# Patient Record
Sex: Female | Born: 1950 | Race: White | Hispanic: No | Smoking: Never smoker
Health system: Southern US, Community
[De-identification: ages and names within clinical notes are randomized; demographics above are authoritative.]

## PROBLEM LIST (undated history)

## (undated) DIAGNOSIS — T4145XA Adverse effect of unspecified anesthetic, initial encounter: Secondary | ICD-10-CM

## (undated) DIAGNOSIS — I10 Essential (primary) hypertension: Secondary | ICD-10-CM

## (undated) DIAGNOSIS — Z8489 Family history of other specified conditions: Secondary | ICD-10-CM

## (undated) DIAGNOSIS — Z9889 Other specified postprocedural states: Secondary | ICD-10-CM

## (undated) DIAGNOSIS — G4730 Sleep apnea, unspecified: Secondary | ICD-10-CM

## (undated) DIAGNOSIS — R112 Nausea with vomiting, unspecified: Secondary | ICD-10-CM

## (undated) DIAGNOSIS — K219 Gastro-esophageal reflux disease without esophagitis: Secondary | ICD-10-CM

## (undated) DIAGNOSIS — T8859XA Other complications of anesthesia, initial encounter: Secondary | ICD-10-CM

## (undated) DIAGNOSIS — M797 Fibromyalgia: Secondary | ICD-10-CM

## (undated) DIAGNOSIS — M1990 Unspecified osteoarthritis, unspecified site: Secondary | ICD-10-CM

## (undated) HISTORY — PX: CARTILAGE SURGERY: SHX1303

## (undated) HISTORY — PX: WRIST SURGERY: SHX841

## (undated) HISTORY — PX: MENISCUS REPAIR: SHX5179

---

## 1982-09-07 HISTORY — PX: TUBAL LIGATION: SHX77

## 1998-05-15 ENCOUNTER — Other Ambulatory Visit: Admission: RE | Admit: 1998-05-15 | Discharge: 1998-05-15 | Payer: Self-pay | Admitting: Obstetrics and Gynecology

## 1999-09-08 HISTORY — PX: ABDOMINAL HYSTERECTOMY: SHX81

## 1999-10-30 ENCOUNTER — Ambulatory Visit (HOSPITAL_COMMUNITY): Admission: RE | Admit: 1999-10-30 | Discharge: 1999-10-30 | Payer: Self-pay | Admitting: Obstetrics and Gynecology

## 2000-02-25 ENCOUNTER — Inpatient Hospital Stay (HOSPITAL_COMMUNITY): Admission: RE | Admit: 2000-02-25 | Discharge: 2000-02-27 | Payer: Self-pay | Admitting: Obstetrics and Gynecology

## 2003-07-18 ENCOUNTER — Ambulatory Visit (HOSPITAL_BASED_OUTPATIENT_CLINIC_OR_DEPARTMENT_OTHER): Admission: RE | Admit: 2003-07-18 | Discharge: 2003-07-18 | Payer: Self-pay | Admitting: Orthopedic Surgery

## 2003-07-18 ENCOUNTER — Ambulatory Visit (HOSPITAL_COMMUNITY): Admission: RE | Admit: 2003-07-18 | Discharge: 2003-07-18 | Payer: Self-pay | Admitting: Orthopedic Surgery

## 2003-09-08 HISTORY — PX: CHOLECYSTECTOMY: SHX55

## 2018-08-08 ENCOUNTER — Other Ambulatory Visit: Payer: Self-pay | Admitting: Orthopedic Surgery

## 2018-08-29 NOTE — Patient Instructions (Addendum)
Harrington ChallengerDeborah D [name]  08/29/2018   Your procedure is scheduled on: 09-12-18    [facility]    Report to Admitting at 7:25 AM    Call this number if you have problems the morning of surgery [phone]    Remember: Do not eat food or drink liquids :After Midnight.    BRUSH YOUR TEETH MORNING OF SURGERY AND RINSE YOUR MOUTH OUT, NO CHEWING GUM CANDY OR MINTS.     Take these medicines the morning of surgery with A SIP OF WATER: Pantoprazole (Protonix), Gabapentin (Neurotin)                                 You may not have any metal on your body including hair pins and              piercings  Do not wear jewelry, make-up, lotions, powders or perfumes, deodorant             Do not wear nail polish.  Do not shave  48 hours prior to surgery.                 Do not bring valuables to the hospital. [name] IS NOT             RESPONSIBLE   FOR VALUABLES.  Contacts, dentures or bridgework may not be worn into surgery.  Leave suitcase in the car. After surgery it may be brought to your room.    :  Special Instructions: N/A              Please read over the following fact sheets you were given: _____________________________________________________________________             [facility] Before surgery, you can play an important role.  Because skin is not sterile, your skin needs to be as free of germs as possible.  You can reduce the number of germs on your skin by washing with CHG (chlorahexidine gluconate) soap before surgery.  CHG is an antiseptic cleaner which kills germs and bonds with the skin to continue killing germs even after washing. Please DO NOT use if you have an allergy to CHG or antibacterial soaps.  If your skin becomes reddened/irritated stop using the CHG and inform your nurse when you arrive at Short Stay. Do not shave (including legs and underarms) for at least 48 hours prior to the first CHG  shower.  You may shave your face/neck. Please follow these instructions carefully:  1.  Shower with CHG Soap the night before surgery and the  morning of Surgery.  2.  If you choose to wash your hair, wash your hair first as usual with your  normal  shampoo.  3.  After you shampoo, rinse your hair and body thoroughly to remove the  shampoo.                           4.  Use CHG as you would any other liquid soap.  You can apply chg directly  to the skin and wash                       Gently with a scrungie or clean washcloth.  5.  Apply  the CHG Soap to your body ONLY FROM THE NECK DOWN.   Do not use on face/ open                           Wound or open sores. Avoid contact with eyes, ears mouth and genitals (private parts).                       Wash face,  Genitals (private parts) with your normal soap.             6.  Wash thoroughly, paying special attention to the area where your surgery  will be performed.  7.  Thoroughly rinse your body with warm water from the neck down.  8.  DO NOT shower/wash with your normal soap after using and rinsing off  the CHG Soap.                9.  Pat yourself dry with a clean towel.            10.  Wear clean pajamas.            11.  Place clean sheets on your bed the night of your first shower and do not  sleep with pets. Day of Surgery : Do not apply any lotions/deodorants the morning of surgery.  Please wear clean clothes to the hospital/surgery center.  FAILURE TO FOLLOW THESE INSTRUCTIONS MAY RESULT IN THE CANCELLATION OF YOUR SURGERY PATIENT SIGNATURE_________________________________  NURSE SIGNATURE__________________________________  ________________________________________________________________________   [name]  An incentive spirometer is a tool that can help keep your lungs clear and active. This tool measures how well you are filling your lungs with each breath. Taking long deep breaths may help reverse or decrease the chance  of developing breathing (pulmonary) problems (especially infection) following:  A long period of time when you are unable to move or be active. BEFORE THE PROCEDURE   If the spirometer includes an indicator to show your best effort, your nurse or respiratory therapist will set it to a desired goal.  If possible, sit up straight or lean slightly forward. Try not to slouch.  Hold the incentive spirometer in an upright position. INSTRUCTIONS FOR USE  1. Sit on the edge of your bed if possible, or sit up as far as you can in bed or on a chair. 2. Hold the incentive spirometer in an upright position. 3. Breathe out normally. 4. Place the mouthpiece in your mouth and seal your lips tightly around it. 5. Breathe in slowly and as deeply as possible, raising the piston or the ball toward the top of the column. 6. Hold your breath for 3-5 seconds or for as long as possible. Allow the piston or ball to fall to the bottom of the column. 7. Remove the mouthpiece from your mouth and breathe out normally. 8. Rest for a few seconds and repeat Steps 1 through 7 at least 10 times every 1-2 hours when you are awake. Take your time and take a few normal breaths between deep breaths. 9. The spirometer may include an indicator to show your best effort. Use the indicator as a goal to work toward during each repetition. 10. After each set of 10 deep breaths, practice coughing to be sure your lungs are clear. If you have an incision (the cut made at the time of surgery), support your incision when coughing by placing a pillow or rolled  up towels firmly against it. Once you are able to get out of bed, walk around indoors and cough well. You may stop using the incentive spirometer when instructed by your caregiver.  RISKS AND COMPLICATIONS  Take your time so you do not get dizzy or light-headed.  If you are in pain, you may need to take or ask for pain medication before doing incentive spirometry. It is harder to  take a deep breath if you are having pain. AFTER USE  Rest and breathe slowly and easily.  It can be helpful to keep track of a log of your progress. Your caregiver can provide you with a simple table to help with this. If you are using the spirometer at home, follow these instructions: SEEK MEDICAL CARE IF:   You are having difficultly using the spirometer.  You have trouble using the spirometer as often as instructed.  Your pain medication is not giving enough relief while using the spirometer.  You develop fever of 100.5 F (38.1 C) or higher. SEEK IMMEDIATE MEDICAL CARE IF:   You cough up bloody sputum that had not been present before.  You develop fever of 102 F (38.9 C) or greater.  You develop worsening pain at or near the incision site. MAKE SURE YOU:   Understand these instructions.  Will watch your condition.  Will get help right away if you are not doing well or get worse. Document Released: 01/04/2007 Document Revised: 11/16/2011 Document Reviewed: 03/07/2007 [facility] Patient Information 2014 [place] [place].   ________________________________________________________________________

## 2018-09-06 ENCOUNTER — Encounter (HOSPITAL_COMMUNITY)
Admission: RE | Admit: 2018-09-06 | Discharge: 2018-09-06 | Disposition: A | Discharge status: Home / Self Care | Payer: Medicare Other | Source: Ambulatory Visit | Attending: Orthopedic Surgery | Admitting: Orthopedic Surgery

## 2018-09-06 ENCOUNTER — Encounter (HOSPITAL_COMMUNITY): Payer: Self-pay | Admitting: *Deleted

## 2018-09-06 ENCOUNTER — Other Ambulatory Visit: Payer: Self-pay

## 2018-09-06 DIAGNOSIS — Z01818 Encounter for other preprocedural examination: Secondary | ICD-10-CM | POA: Insufficient documentation

## 2018-09-06 DIAGNOSIS — M1711 Unilateral primary osteoarthritis, right knee: Secondary | ICD-10-CM | POA: Diagnosis not present

## 2018-09-06 HISTORY — DX: Unspecified osteoarthritis, unspecified site: M19.90

## 2018-09-06 HISTORY — DX: Other complications of anesthesia, initial encounter: T88.59XA

## 2018-09-06 HISTORY — DX: Sleep apnea, unspecified: G47.30

## 2018-09-06 HISTORY — DX: Adverse effect of unspecified anesthetic, initial encounter: T41.45XA

## 2018-09-06 HISTORY — DX: Gastro-esophageal reflux disease without esophagitis: K21.9

## 2018-09-06 HISTORY — DX: Nausea with vomiting, unspecified: R11.2

## 2018-09-06 HISTORY — DX: Fibromyalgia: M79.7

## 2018-09-06 HISTORY — DX: Essential (primary) hypertension: I10

## 2018-09-06 HISTORY — DX: Other specified postprocedural states: Z98.890

## 2018-09-06 LAB — CBC WITH DIFFERENTIAL/PLATELET
Abs Immature Granulocytes: 0.04 10*3/uL (ref 0.00–0.07)
Basophils Absolute: 0 10*3/uL (ref 0.0–0.1)
Basophils Relative: 0 %
Eosinophils Absolute: 0.1 10*3/uL (ref 0.0–0.5)
Eosinophils Relative: 1 %
HCT: 42.5 % (ref 36.0–46.0)
Hemoglobin: 13.1 g/dL (ref 12.0–15.0)
Immature Granulocytes: 1 %
Lymphocytes Relative: 21 %
Lymphs Abs: 1.6 10*3/uL (ref 0.7–4.0)
MCH: 30.1 pg (ref 26.0–34.0)
MCHC: 30.8 g/dL (ref 30.0–36.0)
MCV: 97.7 fL (ref 80.0–100.0)
MONO ABS: 0.7 10*3/uL (ref 0.1–1.0)
Monocytes Relative: 9 %
NEUTROS ABS: 5.3 10*3/uL (ref 1.7–7.7)
Neutrophils Relative %: 68 %
Platelets: 242 10*3/uL (ref 150–400)
RBC: 4.35 MIL/uL (ref 3.87–5.11)
RDW: 12.4 % (ref 11.5–15.5)
WBC: 7.8 10*3/uL (ref 4.0–10.5)
nRBC: 0 % (ref 0.0–0.2)

## 2018-09-06 LAB — COMPREHENSIVE METABOLIC PANEL
ALT: 25 U/L (ref 0–44)
AST: 28 U/L (ref 15–41)
Albumin: 3.9 g/dL (ref 3.5–5.0)
Alkaline Phosphatase: 74 U/L (ref 38–126)
Anion gap: 8 (ref 5–15)
BUN: 14 mg/dL (ref 8–23)
CO2: 29 mmol/L (ref 22–32)
CREATININE: 0.76 mg/dL (ref 0.44–1.00)
Calcium: 9.1 mg/dL (ref 8.9–10.3)
Chloride: 103 mmol/L (ref 98–111)
GFR calc non Af Amer: >60 mL/min (ref >60)
Glucose, Bld: 103 mg/dL — ABNORMAL HIGH (ref 70–99)
Potassium: 4.1 mmol/L (ref 3.5–5.1)
Sodium: 140 mmol/L (ref 135–145)
Total Bilirubin: 0.9 mg/dL (ref 0.3–1.2)
Total Protein: 6.6 g/dL (ref 6.5–8.1)

## 2018-09-06 LAB — SURGICAL PCR SCREEN
MRSA, PCR: NEGATIVE
Staphylococcus aureus: NEGATIVE

## 2018-09-11 MED ORDER — BUPIVACAINE LIPOSOME 1.3 % IJ SUSP
20.0000 mL | Freq: Once | INTRAMUSCULAR | Status: DC
  Filled 2018-09-11: qty 20

## 2018-09-12 ENCOUNTER — Observation Stay (HOSPITAL_COMMUNITY)
Admission: RE | Admit: 2018-09-12 | Discharge: 2018-09-13 | Disposition: A | Discharge status: Home / Self Care | Payer: Medicare Other | Attending: Orthopedic Surgery | Admitting: Orthopedic Surgery

## 2018-09-12 ENCOUNTER — Ambulatory Visit (HOSPITAL_COMMUNITY): Payer: Medicare Other | Admitting: Certified Registered Nurse Anesthetist

## 2018-09-12 ENCOUNTER — Encounter (HOSPITAL_COMMUNITY)
Admission: RE | Disposition: A | Discharge status: Home / Self Care | Payer: Self-pay | Source: Home / Self Care | Attending: Orthopedic Surgery

## 2018-09-12 ENCOUNTER — Other Ambulatory Visit: Payer: Self-pay

## 2018-09-12 ENCOUNTER — Encounter (HOSPITAL_COMMUNITY): Payer: Self-pay | Admitting: *Deleted

## 2018-09-12 ENCOUNTER — Ambulatory Visit (HOSPITAL_COMMUNITY): Payer: Medicare Other | Admitting: Physician Assistant

## 2018-09-12 DIAGNOSIS — Z7982 Long term (current) use of aspirin: Secondary | ICD-10-CM | POA: Diagnosis not present

## 2018-09-12 DIAGNOSIS — M1711 Unilateral primary osteoarthritis, right knee: Secondary | ICD-10-CM | POA: Diagnosis not present

## 2018-09-12 DIAGNOSIS — K219 Gastro-esophageal reflux disease without esophagitis: Secondary | ICD-10-CM | POA: Diagnosis not present

## 2018-09-12 DIAGNOSIS — I10 Essential (primary) hypertension: Secondary | ICD-10-CM | POA: Diagnosis not present

## 2018-09-12 DIAGNOSIS — G4730 Sleep apnea, unspecified: Secondary | ICD-10-CM | POA: Diagnosis not present

## 2018-09-12 DIAGNOSIS — Z79899 Other long term (current) drug therapy: Secondary | ICD-10-CM | POA: Diagnosis not present

## 2018-09-12 DIAGNOSIS — M25561 Pain in right knee: Secondary | ICD-10-CM | POA: Diagnosis present

## 2018-09-12 DIAGNOSIS — Z96659 Presence of unspecified artificial knee joint: Secondary | ICD-10-CM

## 2018-09-12 HISTORY — PX: TOTAL KNEE ARTHROPLASTY: SHX125

## 2018-09-12 SURGERY — ARTHROPLASTY, KNEE, TOTAL
Anesthesia: Regional | Site: Knee | Laterality: Right

## 2018-09-12 MED ORDER — FENTANYL CITRATE (PF) 100 MCG/2ML IJ SOLN
50.0000 ug | Freq: Once | INTRAMUSCULAR | Status: DC
  Filled 2018-09-12: qty 2

## 2018-09-12 MED ORDER — ONDANSETRON HCL 4 MG/2ML IJ SOLN: 4.0000 mg | Freq: Four times a day (QID) | INTRAMUSCULAR | Status: DC | PRN

## 2018-09-12 MED ORDER — APIXABAN 2.5 MG PO TABS
2.5000 mg | ORAL_TABLET | Freq: Two times a day (BID) | ORAL | Status: DC
  Administered 2018-09-13: 2.5 mg via ORAL
  Filled 2018-09-12: qty 1

## 2018-09-12 MED ORDER — ACETAMINOPHEN 500 MG PO TABS
1000.0000 mg | ORAL_TABLET | Freq: Once | ORAL | Status: AC
  Administered 2018-09-12: 1000 mg via ORAL
  Filled 2018-09-12: qty 2

## 2018-09-12 MED ORDER — DEXAMETHASONE SODIUM PHOSPHATE 10 MG/ML IJ SOLN
10.0000 mg | Freq: Once | INTRAMUSCULAR | Status: AC
  Administered 2018-09-13: 10 mg via INTRAVENOUS
  Filled 2018-09-12: qty 1

## 2018-09-12 MED ORDER — ALUM & MAG HYDROXIDE-SIMETH 200-200-20 MG/5ML PO SUSP: 30.0000 mL | ORAL | Status: DC | PRN

## 2018-09-12 MED ORDER — FENTANYL CITRATE (PF) 100 MCG/2ML IJ SOLN: 25.0000 ug | INTRAMUSCULAR | Status: DC | PRN

## 2018-09-12 MED ORDER — METHOCARBAMOL 500 MG PO TABS
500.0000 mg | ORAL_TABLET | Freq: Four times a day (QID) | ORAL | Status: DC | PRN
  Administered 2018-09-12: 500 mg via ORAL
  Filled 2018-09-12: qty 1

## 2018-09-12 MED ORDER — LIDOCAINE 2% (20 MG/ML) 5 ML SYRINGE
INTRAMUSCULAR | Status: AC
  Filled 2018-09-12: qty 5

## 2018-09-12 MED ORDER — PHENOL 1.4 % MT LIQD: 1.0000 | OROMUCOSAL | Status: DC | PRN

## 2018-09-12 MED ORDER — METHOCARBAMOL 500 MG IVPB - SIMPLE MED
500.0000 mg | Freq: Four times a day (QID) | INTRAVENOUS | Status: DC | PRN
  Administered 2018-09-12: 500 mg via INTRAVENOUS
  Filled 2018-09-12: qty 50

## 2018-09-12 MED ORDER — LACTATED RINGERS IV SOLN
INTRAVENOUS | Status: DC
  Administered 2018-09-12 (×2): via INTRAVENOUS

## 2018-09-12 MED ORDER — DEXAMETHASONE SODIUM PHOSPHATE 10 MG/ML IJ SOLN
8.0000 mg | Freq: Once | INTRAMUSCULAR | Status: AC
  Administered 2018-09-12: 8 mg via INTRAVENOUS

## 2018-09-12 MED ORDER — BUPIVACAINE-EPINEPHRINE (PF) 0.25% -1:200000 IJ SOLN
INTRAMUSCULAR | Status: AC
  Filled 2018-09-12: qty 30

## 2018-09-12 MED ORDER — DEXAMETHASONE SODIUM PHOSPHATE 10 MG/ML IJ SOLN
INTRAMUSCULAR | Status: AC
  Filled 2018-09-12: qty 1

## 2018-09-12 MED ORDER — METHOCARBAMOL 500 MG IVPB - SIMPLE MED
INTRAVENOUS | Status: AC
  Filled 2018-09-12: qty 50

## 2018-09-12 MED ORDER — BISACODYL 5 MG PO TBEC: 5.0000 mg | DELAYED_RELEASE_TABLET | Freq: Every day | ORAL | Status: DC | PRN

## 2018-09-12 MED ORDER — PROPOFOL 10 MG/ML IV BOLUS
INTRAVENOUS | Status: AC
  Filled 2018-09-12: qty 60

## 2018-09-12 MED ORDER — METOCLOPRAMIDE HCL 5 MG PO TABS: 5.0000 mg | ORAL_TABLET | Freq: Three times a day (TID) | ORAL | Status: DC | PRN

## 2018-09-12 MED ORDER — ONDANSETRON HCL 4 MG/2ML IJ SOLN
INTRAMUSCULAR | Status: DC | PRN
  Administered 2018-09-12: 4 mg via INTRAVENOUS

## 2018-09-12 MED ORDER — FERROUS SULFATE 325 (65 FE) MG PO TABS
325.0000 mg | ORAL_TABLET | Freq: Three times a day (TID) | ORAL | Status: DC
  Administered 2018-09-12 – 2018-09-13 (×3): 325 mg via ORAL
  Filled 2018-09-12 (×3): qty 1

## 2018-09-12 MED ORDER — TRANEXAMIC ACID-NACL 1000-0.7 MG/100ML-% IV SOLN
1000.0000 mg | INTRAVENOUS | Status: DC
  Filled 2018-09-12: qty 100

## 2018-09-12 MED ORDER — GABAPENTIN 300 MG PO CAPS
300.0000 mg | ORAL_CAPSULE | Freq: Three times a day (TID) | ORAL | Status: DC
  Administered 2018-09-12 – 2018-09-13 (×2): 300 mg via ORAL
  Filled 2018-09-12 (×3): qty 1

## 2018-09-12 MED ORDER — ONDANSETRON HCL 4 MG PO TABS
4.0000 mg | ORAL_TABLET | Freq: Four times a day (QID) | ORAL | Status: DC | PRN
  Administered 2018-09-12 – 2018-09-13 (×3): 4 mg via ORAL
  Filled 2018-09-12 (×3): qty 1

## 2018-09-12 MED ORDER — PROPOFOL 10 MG/ML IV BOLUS
INTRAVENOUS | Status: AC
  Filled 2018-09-12: qty 20

## 2018-09-12 MED ORDER — PROPOFOL 500 MG/50ML IV EMUL
INTRAVENOUS | Status: DC | PRN
  Administered 2018-09-12: 100 ug/kg/min via INTRAVENOUS

## 2018-09-12 MED ORDER — SENNOSIDES-DOCUSATE SODIUM 8.6-50 MG PO TABS: 1.0000 | ORAL_TABLET | Freq: Every evening | ORAL | Status: DC | PRN

## 2018-09-12 MED ORDER — ONDANSETRON HCL 4 MG/2ML IJ SOLN: 4.0000 mg | Freq: Once | INTRAMUSCULAR | Status: DC | PRN

## 2018-09-12 MED ORDER — HYDROMORPHONE HCL 1 MG/ML IJ SOLN: 0.5000 mg | INTRAMUSCULAR | Status: DC | PRN

## 2018-09-12 MED ORDER — LIDOCAINE 2% (20 MG/ML) 5 ML SYRINGE
INTRAMUSCULAR | Status: DC | PRN
  Administered 2018-09-12: 60 mg via INTRAVENOUS

## 2018-09-12 MED ORDER — PANTOPRAZOLE SODIUM 40 MG PO TBEC
40.0000 mg | DELAYED_RELEASE_TABLET | Freq: Two times a day (BID) | ORAL | Status: DC
  Administered 2018-09-12 – 2018-09-13 (×2): 40 mg via ORAL
  Filled 2018-09-12 (×2): qty 1

## 2018-09-12 MED ORDER — 0.9 % SODIUM CHLORIDE (POUR BTL) OPTIME
TOPICAL | Status: DC | PRN
  Administered 2018-09-12: 1000 mL

## 2018-09-12 MED ORDER — GABAPENTIN 300 MG PO CAPS
300.0000 mg | ORAL_CAPSULE | Freq: Two times a day (BID) | ORAL | Status: DC
  Administered 2018-09-12: 300 mg via ORAL

## 2018-09-12 MED ORDER — MIDAZOLAM HCL 2 MG/2ML IJ SOLN
1.0000 mg | Freq: Once | INTRAMUSCULAR | Status: AC
  Administered 2018-09-12: 1 mg via INTRAVENOUS
  Filled 2018-09-12: qty 2

## 2018-09-12 MED ORDER — METOCLOPRAMIDE HCL 5 MG/ML IJ SOLN: 5.0000 mg | Freq: Three times a day (TID) | INTRAMUSCULAR | Status: DC | PRN

## 2018-09-12 MED ORDER — STERILE WATER FOR IRRIGATION IR SOLN
Status: DC | PRN
  Administered 2018-09-12: 2000 mL

## 2018-09-12 MED ORDER — FLEET ENEMA 7-19 GM/118ML RE ENEM: 1.0000 | ENEMA | Freq: Once | RECTAL | Status: DC | PRN

## 2018-09-12 MED ORDER — MENTHOL 3 MG MT LOZG: 1.0000 | LOZENGE | OROMUCOSAL | Status: DC | PRN

## 2018-09-12 MED ORDER — CHLORHEXIDINE GLUCONATE 4 % EX LIQD: 60.0000 mL | Freq: Once | CUTANEOUS | Status: DC

## 2018-09-12 MED ORDER — PHENYLEPHRINE 40 MCG/ML (10ML) SYRINGE FOR IV PUSH (FOR BLOOD PRESSURE SUPPORT)
PREFILLED_SYRINGE | INTRAVENOUS | Status: AC
  Filled 2018-09-12: qty 10

## 2018-09-12 MED ORDER — ACETAMINOPHEN 500 MG PO TABS
1000.0000 mg | ORAL_TABLET | Freq: Four times a day (QID) | ORAL | Status: DC
  Administered 2018-09-12 – 2018-09-13 (×3): 1000 mg via ORAL
  Filled 2018-09-12 (×2): qty 2

## 2018-09-12 MED ORDER — EPHEDRINE SULFATE-NACL 50-0.9 MG/10ML-% IV SOSY
PREFILLED_SYRINGE | INTRAVENOUS | Status: DC | PRN
  Administered 2018-09-12: 5 mg via INTRAVENOUS
  Administered 2018-09-12: 7.5 mg via INTRAVENOUS
  Administered 2018-09-12: 5 mg via INTRAVENOUS

## 2018-09-12 MED ORDER — EPHEDRINE 5 MG/ML INJ
INTRAVENOUS | Status: AC
  Filled 2018-09-12: qty 10

## 2018-09-12 MED ORDER — DOCUSATE SODIUM 100 MG PO CAPS
100.0000 mg | ORAL_CAPSULE | Freq: Two times a day (BID) | ORAL | Status: DC
  Administered 2018-09-12 – 2018-09-13 (×2): 100 mg via ORAL
  Filled 2018-09-12 (×2): qty 1

## 2018-09-12 MED ORDER — OXYCODONE HCL 5 MG PO TABS
5.0000 mg | ORAL_TABLET | ORAL | Status: DC | PRN
  Administered 2018-09-12 – 2018-09-13 (×2): 5 mg via ORAL
  Administered 2018-09-13: 10 mg via ORAL
  Filled 2018-09-12 (×2): qty 2
  Filled 2018-09-12: qty 1

## 2018-09-12 MED ORDER — TRANEXAMIC ACID-NACL 1000-0.7 MG/100ML-% IV SOLN
1000.0000 mg | Freq: Once | INTRAVENOUS | Status: AC
  Administered 2018-09-12: 1000 mg via INTRAVENOUS
  Filled 2018-09-12: qty 100

## 2018-09-12 MED ORDER — SODIUM CHLORIDE 0.9 % IV SOLN
INTRAVENOUS | Status: DC
  Administered 2018-09-12: 17:00:00 via INTRAVENOUS

## 2018-09-12 MED ORDER — GABAPENTIN 300 MG PO CAPS
300.0000 mg | ORAL_CAPSULE | Freq: Once | ORAL | Status: DC
  Filled 2018-09-12: qty 1

## 2018-09-12 MED ORDER — LOSARTAN POTASSIUM 50 MG PO TABS
100.0000 mg | ORAL_TABLET | Freq: Every day | ORAL | Status: DC
  Administered 2018-09-12 – 2018-09-13 (×2): 100 mg via ORAL
  Filled 2018-09-12 (×2): qty 2

## 2018-09-12 MED ORDER — SODIUM CHLORIDE (PF) 0.9 % IJ SOLN
INTRAMUSCULAR | Status: AC
  Filled 2018-09-12: qty 20

## 2018-09-12 MED ORDER — PHENYLEPHRINE 40 MCG/ML (10ML) SYRINGE FOR IV PUSH (FOR BLOOD PRESSURE SUPPORT)
PREFILLED_SYRINGE | INTRAVENOUS | Status: DC | PRN
  Administered 2018-09-12 (×3): 80 ug via INTRAVENOUS

## 2018-09-12 MED ORDER — SODIUM CHLORIDE 0.9 % IR SOLN
Status: DC | PRN
  Administered 2018-09-12: 1000 mL

## 2018-09-12 MED ORDER — CEFAZOLIN SODIUM-DEXTROSE 2-4 GM/100ML-% IV SOLN
2.0000 g | Freq: Four times a day (QID) | INTRAVENOUS | Status: AC
  Administered 2018-09-12 – 2018-09-13 (×2): 2 g via INTRAVENOUS
  Filled 2018-09-12 (×2): qty 100

## 2018-09-12 MED ORDER — SODIUM CHLORIDE 0.9% FLUSH
INTRAVENOUS | Status: DC | PRN
  Administered 2018-09-12: 20 mL

## 2018-09-12 MED ORDER — PROPOFOL 10 MG/ML IV BOLUS
INTRAVENOUS | Status: DC | PRN
  Administered 2018-09-12: 20 mg via INTRAVENOUS
  Administered 2018-09-12: 30 mg via INTRAVENOUS

## 2018-09-12 MED ORDER — PANTOPRAZOLE SODIUM 40 MG PO TBEC: 40.0000 mg | DELAYED_RELEASE_TABLET | Freq: Every day | ORAL | Status: DC

## 2018-09-12 MED ORDER — BUPIVACAINE-EPINEPHRINE 0.25% -1:200000 IJ SOLN
INTRAMUSCULAR | Status: DC | PRN
  Administered 2018-09-12: 12 mL

## 2018-09-12 MED ORDER — BUPIVACAINE HCL (PF) 0.75 % IJ SOLN
INTRAMUSCULAR | Status: DC | PRN
  Administered 2018-09-12: 1.6 mL via INTRATHECAL

## 2018-09-12 MED ORDER — ROPIVACAINE HCL 5 MG/ML IJ SOLN
INTRAMUSCULAR | Status: DC | PRN
  Administered 2018-09-12: 30 mL via PERINEURAL

## 2018-09-12 MED ORDER — BUPIVACAINE LIPOSOME 1.3 % IJ SUSP
INTRAMUSCULAR | Status: DC | PRN
  Administered 2018-09-12: 20 mL

## 2018-09-12 MED ORDER — ONDANSETRON HCL 4 MG/2ML IJ SOLN
INTRAMUSCULAR | Status: AC
  Filled 2018-09-12: qty 2

## 2018-09-12 MED ORDER — CEFAZOLIN SODIUM-DEXTROSE 2-4 GM/100ML-% IV SOLN
2.0000 g | INTRAVENOUS | Status: AC
  Administered 2018-09-12: 2 g via INTRAVENOUS
  Filled 2018-09-12: qty 100

## 2018-09-12 MED ORDER — ZOLPIDEM TARTRATE 5 MG PO TABS: 5.0000 mg | ORAL_TABLET | Freq: Every evening | ORAL | Status: DC | PRN

## 2018-09-12 MED ORDER — DIPHENHYDRAMINE HCL 12.5 MG/5ML PO ELIX: 12.5000 mg | ORAL_SOLUTION | ORAL | Status: DC | PRN

## 2018-09-12 SURGICAL SUPPLY — 56 items
ARTISURF 11M PLY R 6-9EF KNEE (Knees) ×2 IMPLANT
BAG ZIPLOCK 12X15 (MISCELLANEOUS) ×3 IMPLANT
BANDAGE ACE 6X5 VEL STRL LF (GAUZE/BANDAGES/DRESSINGS) ×3 IMPLANT
BANDAGE ELASTIC 6 VELCRO ST LF (GAUZE/BANDAGES/DRESSINGS) ×2 IMPLANT
BLADE SAGITTAL 13X1.27X60 (BLADE) ×2 IMPLANT
BLADE SAGITTAL 13X1.27X60MM (BLADE) ×1
BLADE SAW SGTL 83.5X18.5 (BLADE) ×3 IMPLANT
BLADE SURG 15 STRL LF DISP TIS (BLADE) ×1 IMPLANT
BLADE SURG 15 STRL SS (BLADE) ×2
BLADE SURG SZ10 CARB STEEL (BLADE) ×6 IMPLANT
BOWL SMART MIX CTS (DISPOSABLE) ×3 IMPLANT
CEMENT BONE SIMPLEX SPEEDSET (Cement) ×6 IMPLANT
CLOSURE WOUND 1/2 X4 (GAUZE/BANDAGES/DRESSINGS) ×2
COMP FEM PERSONA SZ7 RT (Joint) ×3 IMPLANT
COMPONENT FEM PERSONA SZ7 RT (Joint) IMPLANT
COVER SURGICAL LIGHT HANDLE (MISCELLANEOUS) ×3 IMPLANT
COVER WAND RF STERILE (DRAPES) IMPLANT
CUFF TOURN SGL QUICK 34 (TOURNIQUET CUFF) ×2
CUFF TRNQT CYL 34X4X40X1 (TOURNIQUET CUFF) ×1 IMPLANT
DECANTER SPIKE VIAL GLASS SM (MISCELLANEOUS) ×6 IMPLANT
DRAPE INCISE IOBAN 66X45 STRL (DRAPES) ×6 IMPLANT
DRAPE U-SHAPE 47X51 STRL (DRAPES) ×3 IMPLANT
DRSG AQUACEL AG ADV 3.5X10 (GAUZE/BANDAGES/DRESSINGS) ×3 IMPLANT
DURAPREP 26ML APPLICATOR (WOUND CARE) ×6 IMPLANT
ELECT REM PT RETURN 15FT ADLT (MISCELLANEOUS) ×3 IMPLANT
GLOVE BIOGEL M STRL SZ7.5 (GLOVE) ×3 IMPLANT
GLOVE BIOGEL PI IND STRL 7.5 (GLOVE) ×1 IMPLANT
GLOVE BIOGEL PI IND STRL 8.5 (GLOVE) ×2 IMPLANT
GLOVE BIOGEL PI INDICATOR 7.5 (GLOVE) ×2
GLOVE BIOGEL PI INDICATOR 8.5 (GLOVE) ×2
GLOVE SURG ORTHO 8.0 STRL STRW (GLOVE) ×7 IMPLANT
GOWN STRL REUS W/ TWL XL LVL3 (GOWN DISPOSABLE) ×2 IMPLANT
GOWN STRL REUS W/TWL XL LVL3 (GOWN DISPOSABLE) ×4
HANDPIECE INTERPULSE COAX TIP (DISPOSABLE) ×2
HOLDER FOLEY CATH W/STRAP (MISCELLANEOUS) ×3 IMPLANT
HOOD PEEL AWAY FLYTE STAYCOOL (MISCELLANEOUS) ×9 IMPLANT
MANIFOLD NEPTUNE II (INSTRUMENTS) ×3 IMPLANT
NS IRRIG 1000ML POUR BTL (IV SOLUTION) ×3 IMPLANT
PACK TOTAL KNEE CUSTOM (KITS) ×3 IMPLANT
PROTECTOR NERVE ULNAR (MISCELLANEOUS) ×3 IMPLANT
SET HNDPC FAN SPRY TIP SCT (DISPOSABLE) ×1 IMPLANT
STEM POLY PAT PLY 35M KNEE (Knees) ×2 IMPLANT
STEM TIBIA 5 DEG SZ E R KNEE (Knees) IMPLANT
STRIP CLOSURE SKIN 1/2X4 (GAUZE/BANDAGES/DRESSINGS) ×3 IMPLANT
SUT BONE WAX W31G (SUTURE) ×3 IMPLANT
SUT MNCRL AB 3-0 PS2 18 (SUTURE) ×3 IMPLANT
SUT STRATAFIX 0 PDS 27 VIOLET (SUTURE) ×3
SUT STRATAFIX PDS+ 0 24IN (SUTURE) ×3 IMPLANT
SUT VIC AB 1 CT1 36 (SUTURE) ×3 IMPLANT
SUTURE STRATFX 0 PDS 27 VIOLET (SUTURE) ×1 IMPLANT
SYR CONTROL 10ML LL (SYRINGE) ×6 IMPLANT
TIBIA STEM 5 DEG SZ E R KNEE (Knees) ×3 IMPLANT
TRAY FOLEY CATH 14FR (SET/KITS/TRAYS/PACK) ×2 IMPLANT
WATER STERILE IRR 1000ML POUR (IV SOLUTION) ×6 IMPLANT
WRAP KNEE MAXI GEL POST OP (GAUZE/BANDAGES/DRESSINGS) ×3 IMPLANT
YANKAUER SUCT BULB TIP 10FT TU (MISCELLANEOUS) ×3 IMPLANT

## 2018-09-12 NOTE — Transfer of Care (Signed)
Immediate Anesthesia Transfer of Care Note  Patient: Kendra [name]  Procedure(s) Performed: TOTAL KNEE ARTHROPLASTY (Right Knee)  Patient Location: PACU  Anesthesia Type:Spinal  Level of Consciousness: awake, alert , oriented and patient cooperative  Airway & Oxygen Therapy: Patient Spontanous Breathing  Post-op Assessment: Report given to RN and Post -op Vital signs reviewed and stable  Post vital signs: Reviewed and stable  Last Vitals:  Vitals Value Taken Time  BP 125/66 09/12/2018 12:52 PM  Temp    Pulse 69 09/12/2018 12:53 PM  Resp 16 09/12/2018 12:53 PM  SpO2 96 % 09/12/2018 12:53 PM  Vitals shown include unvalidated device data.  Last Pain:  Vitals:   09/12/18 1030  TempSrc:   PainSc: 0-No pain         Complications: No apparent anesthesia complications

## 2018-09-12 NOTE — Anesthesia Procedure Notes (Signed)
Anesthesia Regional Block: Adductor canal block   Pre-Anesthetic Checklist: ,, timeout performed, Correct Patient, Correct Site, Correct Laterality, Correct Procedure,, site marked, risks and benefits discussed, Surgical consent,  Pre-op evaluation,  At surgeon's request and post-op pain management  Laterality: Right  Prep: chloraprep       Needles:  Injection technique: Single-shot  Needle Type: Echogenic Stimulator Needle     Needle Length: 9cm  Needle Gauge: 21     Additional Needles:   Procedures:,,,, ultrasound used (permanent image in chart),,,,  Narrative:  Start time: 09/12/2018 10:10 AM End time: 09/12/2018 10:20 AM Injection made incrementally with aspirations every 5 mL.  Performed by: Personally  Anesthesiologist: Leonides GrillsEllender, Ryan P, MD  Additional Notes: Functioning IV was confirmed and monitors were applied. A time-out was performed. Hand hygiene and sterile gloves were used. The thigh was placed in a frog-leg position and prepped in a sterile fashion. A 90mm 21ga Arrow echogenic stimulator needle was placed using ultrasound guidance.  Negative aspiration and negative test dose prior to incremental administration of local anesthetic. The patient tolerated the procedure well.

## 2018-09-12 NOTE — Anesthesia Postprocedure Evaluation (Signed)
Anesthesia Post Note  Patient: Veronia Piotrowicz Beal  Procedure(s) Performed: TOTAL KNEE ARTHROPLASTY (Right Knee)     Patient location during evaluation: PACU Anesthesia Type: Regional Level of consciousness: oriented and awake and alert Pain management: pain level controlled Vital Signs Assessment: post-procedure vital signs reviewed and stable Respiratory status: spontaneous breathing, respiratory function stable and patient connected to nasal cannula oxygen Cardiovascular status: blood pressure returned to baseline and stable Postop Assessment: no headache, no backache and no apparent nausea or vomiting Anesthetic complications: no    Last Vitals:  Vitals:   09/12/18 1415 09/12/18 1430  BP: (!) 143/77 (!) 170/79  Pulse: 60 62  Resp: 15 16  Temp: (!) 36.3 C (!) 36.4 C  SpO2: 100% 100%    Last Pain:  Vitals:   09/12/18 1430  TempSrc:   PainSc: 0-No pain                 Hodges Treiber COKER

## 2018-09-12 NOTE — Evaluation (Signed)
Physical Therapy Evaluation Patient Details Name: Kendra [name] MRN: [id number] DOB: 1950/09/17 Today's Date: 09/12/2018   History of Present Illness  RTKA  Clinical Impression  The patient ambulated x 100'. Plans Dc home. Pt admitted with above diagnosis. Pt currently with functional limitations due to the deficits listed below (see PT Problem List).  Pt will benefit from skilled PT to increase their independence and safety with mobility to allow discharge to the venue listed below.      Follow Up Recommendations Follow surgeon's recommendation for DC plan and follow-up therapies;Home health PT    Equipment Recommendations  None recommended by PT    Recommendations for Other Services       Precautions / Restrictions Precautions Precautions: Knee;Fall      Mobility  Bed Mobility Overal bed mobility: Needs Assistance Bed Mobility: Supine to Sit     Supine to sit: Min assist     General bed mobility comments: support right knee  Transfers Overall transfer level: Needs assistance Equipment used: Rolling walker (2 wheeled) Transfers: Sit to/from Stand Sit to Stand: Min assist         General transfer comment: cues for hand and  right leg  Ambulation/Gait Ambulation/Gait assistance: Min guard;Min assist Gait Distance (Feet): 100 Feet Assistive device: Rolling walker (2 wheeled) Gait Pattern/deviations: Step-to pattern;Step-through pattern     General Gait Details: cues for sequence  Stairs            Wheelchair Mobility    Modified Rankin (Stroke Patients Only)       Balance                                             Pertinent Vitals/Pain Pain Assessment: 0-10 Pain Score: 3  Pain Location: right knee Pain Descriptors / Indicators: Aching;Discomfort Pain Intervention(s): Premedicated before session;Monitored during session;Ice applied    Home Living Family/patient expects to be discharged to:: Private residence Living  Arrangements: Spouse/significant other;Children Available Help at Discharge: Family Type of Home: House Home Access: Stairs to enter Entrance Stairs-Rails: None Entrance Stairs-Number of Steps: 3 Home Layout: One level Home Equipment: Environmental consultant - 2 wheels;Bedside commode      Prior Function Level of Independence: Independent               Hand Dominance        Extremity/Trunk Assessment   Upper Extremity Assessment Upper Extremity Assessment: Overall WFL for tasks assessed    Lower Extremity Assessment Lower Extremity Assessment: RLE deficits/detail RLE Deficits / Details: + SLR, knne flex0-50       Communication   Communication: No difficulties  Cognition Arousal/Alertness: Awake/alert Behavior During Therapy: WFL for tasks assessed/performed Overall Cognitive Status: Within Functional Limits for tasks assessed                                        General Comments      Exercises     Assessment/Plan    PT Assessment    PT Problem List         PT Treatment Interventions      PT Goals (Current goals can be found in the Care Plan section)  Acute Rehab PT Goals Patient Stated Goal: to go home PT Goal Formulation: With patient Time For Goal  Achievement: 09/19/18 Potential to Achieve Goals: Good    Frequency     Barriers to discharge        Co-evaluation               AM-PAC PT "6 Clicks" Mobility  Outcome Measure Help needed turning from your back to your side while in a flat bed without using bedrails?: A Little Help needed moving from lying on your back to sitting on the side of a flat bed without using bedrails?: A Little Help needed moving to and from a bed to a chair (including a wheelchair)?: A Little Help needed standing up from a chair using your arms (e.g., wheelchair or bedside chair)?: A Little Help needed to walk in hospital room?: A Little Help needed climbing 3-5 steps with a railing? : A Lot 6 Click Score:  17    End of Session   Activity Tolerance: Patient tolerated treatment well Patient left: in chair;with call bell/phone within reach;with family/visitor present Nurse Communication: Mobility status PT Visit Diagnosis: Unsteadiness on feet (R26.81);Pain Pain - Right/Left: Right Pain - part of body: Knee    Time: 1740-1758 PT Time Calculation (min) (ACUTE ONLY): 18 min   Charges:   PT Evaluation $PT Eval Low Complexity: 1 Low          Blanchard Kelch PT Acute Rehabilitation Services Pager [phone] Office [phone]   Rada Hay 09/12/2018, 6:12 PM

## 2018-09-12 NOTE — Anesthesia Preprocedure Evaluation (Addendum)
Anesthesia Evaluation  Patient identified by MRN, date of birth, ID band Patient awake    Reviewed: Allergy & Precautions, NPO status , Patient's Chart, lab work & pertinent test results  History of Anesthesia Complications (+) PONV and history of anesthetic complications  Airway Mallampati: II  TM Distance: >3 FB Neck ROM: Full    Dental no notable dental hx.    Pulmonary sleep apnea ,    Pulmonary exam normal breath sounds clear to auscultation       Cardiovascular hypertension, Pt. on medications Normal cardiovascular exam Rhythm:Regular Rate:Normal     Neuro/Psych negative neurological ROS  negative psych ROS   GI/Hepatic Neg liver ROS, GERD  Medicated and Controlled,  Endo/Other  negative endocrine ROS  Renal/GU negative Renal ROS     Musculoskeletal  (+) Fibromyalgia -  Abdominal (+) + obese,   Peds  Hematology negative hematology ROS (+)   Anesthesia Other Findings RIGHT KNEE OSTEOARTHRITIS  Reproductive/Obstetrics                            Anesthesia Physical Anesthesia Plan  ASA: III  Anesthesia Plan: Regional and Spinal   Post-op Pain Management:    Induction:   PONV Risk Score and Plan: 3 and Midazolam, Ondansetron, Dexamethasone, Propofol infusion and Treatment may vary due to age or medical condition  Airway Management Planned: Natural Airway  Additional Equipment:   Intra-op Plan:   Post-operative Plan:   Informed Consent: I have reviewed the patients History and Physical, chart, labs and discussed the procedure including the risks, benefits and alternatives for the proposed anesthesia with the patient or authorized representative who has indicated his/her understanding and acceptance.   Dental advisory given  Plan Discussed with: CRNA  Anesthesia Plan Comments:        Anesthesia Quick Evaluation

## 2018-09-12 NOTE — Discharge Instructions (Signed)

## 2018-09-12 NOTE — H&P (Signed)
Kendra [name] MRN:  [id number] DOB/SEX:  1951/05/08/female  CHIEF COMPLAINT:  Painful right Knee  HISTORY: Patient is a 68 y.o. female presented with a history of pain in the right knee. Onset of symptoms was gradual starting a few years ago with gradually worsening course since that time. Patient has been treated conservatively with over-the-counter NSAIDs and activity modification. Patient currently rates pain in the knee at 10 out of 10 with activity. There is pain at night.  PAST MEDICAL HISTORY: There are no active problems to display for this patient.  Past Medical History:  Diagnosis Date  . Arthritis   . Complication of anesthesia   . Fibromyalgia   . GERD (gastroesophageal reflux disease)   . Hypertension   . PONV (postoperative nausea and vomiting)   . Sleep apnea    uses mouth guard   Past Surgical History:  Procedure Laterality Date  . ABDOMINAL HYSTERECTOMY  2001  . CARTILAGE SURGERY    . CESAREAN SECTION  1967  . CHOLECYSTECTOMY  2005  . MENISCUS REPAIR    . TUBAL LIGATION  1984  . WRIST SURGERY Right    Torn Ligament     MEDICATIONS:   Medications Prior to Admission  Medication Sig Dispense Refill Last Dose  . acetaminophen (TYLENOL) 500 MG tablet Take 500 mg by mouth every 6 (six) hours as needed for moderate pain.   09/11/2018 at Unknown time  . aspirin EC 81 MG tablet Take 81 mg by mouth daily.      . ferrous sulfate 325 (65 FE) MG EC tablet Take 325 mg by mouth daily with breakfast.   09/11/2018 at Unknown time  . gabapentin (NEURONTIN) 300 MG capsule Take 300 mg by mouth 2 (two) times daily.    09/12/2018 at 0500  . losartan (COZAAR) 100 MG tablet Take 100 mg by mouth daily.   09/11/2018 at Unknown time  . pantoprazole (PROTONIX) 40 MG tablet Take 40 mg by mouth 2 (two) times daily.   09/12/2018 at 0800    ALLERGIES:   Allergies  Allergen Reactions  . Dilaudid [Hydromorphone] Itching  . Hydrocodone-Acetaminophen Nausea And Vomiting  . Nsaids     GI  Issues  . Tramadol Hcl Nausea And Vomiting  . Prednisone Rash    REVIEW OF SYSTEMS:  A comprehensive review of systems was negative except for: Musculoskeletal: positive for arthralgias and bone pain   FAMILY HISTORY:  History reviewed. No pertinent family history.  SOCIAL HISTORY:   Social History   Tobacco Use  . Smoking status: Never Smoker  . Smokeless tobacco: Never Used  Substance Use Topics  . Alcohol use: Never    Frequency: Never     EXAMINATION:  Vital signs in last 24 hours: Temp:  [97.8 F (36.6 C)] 97.8 F (36.6 C) (01/06 0801) Pulse Rate:  [73] 73 (01/06 0801) Resp:  [16] 16 (01/06 0801) BP: (175-199)/(90-95) 175/90 (01/06 0846) SpO2:  [100 %] 100 % (01/06 0801)  BP (!) 175/90   Pulse 73   Temp 97.8 F (36.6 C) (Oral)   Resp 16   SpO2 100%   General Appearance:    Alert, cooperative, no distress, appears stated age  Head:    Normocephalic, without obvious abnormality, atraumatic  Eyes:    PERRL, conjunctiva/corneas clear, EOM's intact, fundi    benign, both eyes  Ears:    Normal TM's and external ear canals, both ears  Nose:   Nares normal, septum midline, mucosa normal, no drainage  or sinus tenderness  Throat:   Lips, mucosa, and tongue normal; teeth and gums normal  Neck:   Supple, symmetrical, trachea midline, no adenopathy;    thyroid:  no enlargement/tenderness/nodules; no carotid   bruit or JVD  Back:     Symmetric, no curvature, ROM normal, no CVA tenderness  Lungs:     Clear to auscultation bilaterally, respirations unlabored  Chest Wall:    No tenderness or deformity   Heart:    Regular rate and rhythm, S1 and S2 normal, no murmur, rub   or gallop  Breast Exam:    No tenderness, masses, or nipple abnormality  Abdomen:     Soft, non-tender, bowel sounds active all four quadrants,    no masses, no organomegaly  Genitalia:    Normal female without lesion, discharge or tenderness  Rectal:    Normal tone, normal prostate, no masses or  tenderness;   guaiac negative stool  Extremities:   Extremities normal, atraumatic, no cyanosis or edema  Pulses:   2+ and symmetric all extremities  Skin:   Skin color, texture, turgor normal, no rashes or lesions  Lymph nodes:   Cervical, supraclavicular, and axillary nodes normal  Neurologic:   CNII-XII intact, normal strength, sensation and reflexes    throughout    Musculoskeletal:  ROM 0-120, Ligaments intact,  Imaging Review Plain radiographs demonstrate severe degenerative joint disease of the right knee. The overall alignment is neutral. The bone quality appears to be good for age and reported activity level.  Assessment/Plan: Primary osteoarthritis, right knee   The patient history, physical examination and imaging studies are consistent with advanced degenerative joint disease of the right knee. The patient has failed conservative treatment.  The clearance notes were reviewed.  After discussion with the patient it was felt that Total Knee Replacement was indicated. The procedure,  risks, and benefits of total knee arthroplasty were presented and reviewed. The risks including but not limited to aseptic loosening, infection, blood clots, vascular injury, stiffness, patella tracking problems complications among others were discussed. The patient acknowledged the explanation, agreed to proceed with the plan.  Preoperative templating of the joint replacement has been completed, documented, and submitted to the Operating Room personnel in order to optimize intra-operative equipment management.    Patient's anticipated LOS is less than 2 midnights, meeting these requirements:  - Lives within 1 hour of care - Has a competent adult at home to recover with post-op recover - NO history of  - Chronic pain requiring opiods  - Diabetes  - Coronary Artery Disease  - Heart failure  - Heart attack  - Stroke  - DVT/VTE  - Cardiac arrhythmia  - Respiratory Failure/COPD  - Renal  failure  - Anemia  - Advanced Liver disease       Kendra [name] 09/12/2018, 8:57 AM

## 2018-09-12 NOTE — Anesthesia Procedure Notes (Signed)
Date/Time: 09/12/2018 11:20 AM Performed by: Vanessa Palmer, CRNA Oxygen Delivery Method: Simple face mask

## 2018-09-12 NOTE — Anesthesia Procedure Notes (Addendum)
Spinal  Patient location during procedure: OR Start time: 09/12/2018 11:02 AM End time: 09/12/2018 11:07 AM Staffing Anesthesiologist: Leonides Grills, MD Resident/CRNA: Vanessa Loomis, CRNA Performed: resident/CRNA  Preanesthetic Checklist Completed: patient identified, site marked, surgical consent, pre-op evaluation, timeout performed, IV checked, risks and benefits discussed and monitors and equipment checked Spinal Block Patient position: sitting Prep: DuraPrep Patient monitoring: heart rate, continuous pulse ox and blood pressure Approach: midline Location: L2-3 Injection technique: single-shot Needle Needle type: Pencan  Needle gauge: 24 G Needle length: 10 cm Needle insertion depth: 9 cm Assessment Sensory level: T6

## 2018-09-13 ENCOUNTER — Encounter (HOSPITAL_COMMUNITY): Payer: Self-pay | Admitting: Orthopedic Surgery

## 2018-09-13 DIAGNOSIS — M1711 Unilateral primary osteoarthritis, right knee: Secondary | ICD-10-CM | POA: Diagnosis not present

## 2018-09-13 LAB — BASIC METABOLIC PANEL
ANION GAP: 8 (ref 5–15)
BUN: 10 mg/dL (ref 8–23)
CO2: 26 mmol/L (ref 22–32)
Calcium: 8.3 mg/dL — ABNORMAL LOW (ref 8.9–10.3)
Chloride: 106 mmol/L (ref 98–111)
Creatinine, Ser: 0.64 mg/dL (ref 0.44–1.00)
GFR calc non Af Amer: >60 mL/min (ref >60)
Glucose, Bld: 128 mg/dL — ABNORMAL HIGH (ref 70–99)
Potassium: 3.9 mmol/L (ref 3.5–5.1)
Sodium: 140 mmol/L (ref 135–145)

## 2018-09-13 LAB — CBC
HCT: 34.7 % — ABNORMAL LOW (ref 36.0–46.0)
Hemoglobin: 10.9 g/dL — ABNORMAL LOW (ref 12.0–15.0)
MCH: 30.7 pg (ref 26.0–34.0)
MCHC: 31.4 g/dL (ref 30.0–36.0)
MCV: 97.7 fL (ref 80.0–100.0)
Platelets: 209 10*3/uL (ref 150–400)
RBC: 3.55 MIL/uL — ABNORMAL LOW (ref 3.87–5.11)
RDW: 12.5 % (ref 11.5–15.5)
WBC: 12.5 10*3/uL — AB (ref 4.0–10.5)
nRBC: 0 % (ref 0.0–0.2)

## 2018-09-13 MED ORDER — APIXABAN 2.5 MG PO TABS: 2.5000 mg | ORAL_TABLET | Freq: Two times a day (BID) | ORAL | 0 refills | Status: DC

## 2018-09-13 MED ORDER — OXYCODONE HCL 5 MG PO TABS: 5.0000 mg | ORAL_TABLET | Freq: Four times a day (QID) | ORAL | 0 refills | Status: DC | PRN

## 2018-09-13 MED ORDER — METHOCARBAMOL 500 MG PO TABS: 500.0000 mg | ORAL_TABLET | Freq: Four times a day (QID) | ORAL | 0 refills | Status: DC | PRN

## 2018-09-13 NOTE — Plan of Care (Signed)
  Problem: Clinical Measurements: Goal: Respiratory complications will improve Outcome: Progressing   Problem: Clinical Measurements: Goal: Cardiovascular complication will be avoided Outcome: Progressing   Problem: Activity: Goal: Risk for activity intolerance will decrease Outcome: Progressing   Problem: Nutrition: Goal: Adequate nutrition will be maintained Outcome: Progressing   Problem: Coping: Goal: Level of anxiety will decrease Outcome: Progressing   

## 2018-09-13 NOTE — Care Management Obs Status (Signed)
MEDICARE OBSERVATION STATUS NOTIFICATION   Patient Details  Name: Kendra GipDeborah D Novicki MRN: [id number] Date of Birth: 1950/10/14   Medicare Observation Status Notification Given:  Yes    Alexis Goodelleele, Nyzier Boivin K, RN 09/13/2018, 11:15 AM

## 2018-09-13 NOTE — Discharge Summary (Signed)
SPORTS MEDICINE & JOINT REPLACEMENT   Georgena Spurling, MD   Laurier Nancy, PA-C [address] [place], [place], [place]  [address]                             [phone]  PATIENT ID: Kendra [name]        MRN:  [id number]          DOB/AGE: [birth year] / 68 y.o.    DISCHARGE SUMMARY  ADMISSION DATE:    09/12/2018 DISCHARGE DATE:   09/13/2018   ADMISSION DIAGNOSIS: RT. KNEE OSTEOARTHRITIS    DISCHARGE DIAGNOSIS:  RT. KNEE OSTEOARTHRITIS    ADDITIONAL DIAGNOSIS: Active Problems:   S/P total knee replacement  Past Medical History:  Diagnosis Date  . Arthritis   . Complication of anesthesia   . Fibromyalgia   . GERD (gastroesophageal reflux disease)   . Hypertension   . PONV (postoperative nausea and vomiting)   . Sleep apnea    uses mouth guard    PROCEDURE: Procedure(s): TOTAL KNEE ARTHROPLASTY on 09/12/2018  CONSULTS:    HISTORY:  See H&P in chart  HOSPITAL COURSE:  CAEDANCE ZUPAN is a 68 y.o. admitted on 09/12/2018 and found to have a diagnosis of RT. KNEE OSTEOARTHRITIS.  After appropriate laboratory studies were obtained  they were taken to the operating room on 09/12/2018 and underwent Procedure(s): TOTAL KNEE ARTHROPLASTY.   They were given perioperative antibiotics:  Anti-infectives (From admission, onward)   Start     Dose/Rate Route Frequency Ordered Stop   09/12/18 1800  ceFAZolin (ANCEF) IVPB 2g/100 mL premix     2 g 200 mL/hr over 30 Minutes Intravenous Every 6 hours 09/12/18 1453 09/13/18 0041   09/12/18 0745  ceFAZolin (ANCEF) IVPB 2g/100 mL premix     2 g 200 mL/hr over 30 Minutes Intravenous On call to O.R. 09/12/18 6720 09/12/18 1123    .  Patient given tranexamic acid IV or topical and exparel intra-operatively.  Tolerated the procedure well.    POD# 1: Vital signs were stable.  Patient denied Chest pain, shortness of breath, or calf pain.  Patient was started on Aspirin twice daily at 8am.  Consults to PT, OT, and care management were made.  The  patient was weight bearing as tolerated.  CPM was placed on the operative leg 0-90 degrees for 6-8 hours a day. When out of the CPM, patient was placed in the foam block to achieve full extension. Incentive spirometry was taught.  Dressing was changed.       POD #2, Continued  PT for ambulation and exercise program.  IV saline locked.  O2 discontinued.    The remainder of the hospital course was dedicated to ambulation and strengthening.   The patient was discharged on 1 Day Post-Op in  Good condition.  Blood products given:none  DIAGNOSTIC STUDIES: Recent vital signs:  Patient Vitals for the past 24 hrs:  BP Temp Temp src Pulse Resp SpO2 Height Weight  09/13/18 0458 139/70 98 F (36.7 C) Oral 67 16 99 % - -  09/13/18 0121 130/66 97.8 F (36.6 C) Oral 68 16 97 % - -  09/12/18 2040 (!) 145/76 97.9 F (36.6 C) Oral 74 16 98 % - -  09/12/18 1925 (!) 152/68 97.9 F (36.6 C) - 68 16 94 % - -  09/12/18 1826 (!) 159/84 97.6 F (36.4 C) - 79 16 90 % - -  09/12/18  1726 (!) 169/81 97.8 F (36.6 C) Oral 74 15 99 % - -  09/12/18 1636 (!) 162/90 (!) 97.5 F (36.4 C) - 76 16 97 % 5\' 5"  (1.651 m) 102.1 kg  09/12/18 1530 (!) 172/83 (!) 97.5 F (36.4 C) - 74 16 97 % - -  09/12/18 1430 (!) 170/79 (!) 97.5 F (36.4 C) - 62 16 100 % - -  09/12/18 1415 (!) 143/77 (!) 97.4 F (36.3 C) - 60 15 100 % - -  09/12/18 1400 (!) 155/81 (!) 97.4 F (36.3 C) - 63 15 98 % - -  09/12/18 1345 (!) 157/71 (!) 97.4 F (36.3 C) - 62 17 100 % - -  09/12/18 1330 (!) 145/71 (!) 97.4 F (36.3 C) - (!) 58 12 100 % - -  09/12/18 1315 138/73 - - 61 13 100 % - -  09/12/18 1300 129/70 - - 67 20 100 % - -  09/12/18 1252 125/66 - - 69 14 94 % - -  09/12/18 1030 120/73 - - 66 13 100 % - -  09/12/18 1028 - - - 66 16 100 % - -  09/12/18 1027 - - - 67 18 100 % - -  09/12/18 1026 - - - 69 20 100 % - -  09/12/18 1025 - - - 64 13 100 % - -  09/12/18 1024 (!) 158/74 - - 72 - 100 % - -  09/12/18 1023 - - - 67 - 99 % - -   09/12/18 1022 - - - 70 - 97 % - -  09/12/18 1021 - - - 77 - 97 % - -  09/12/18 1020 - - - 76 12 98 % - -  09/12/18 1019 - - - 74 - 100 % - -  09/12/18 1018 (!) 173/89 - - - - - - -  09/12/18 1015 (!) 167/91 - - 74 13 100 % - -  09/12/18 0846 (!) 175/90 - - - - - - -  09/12/18 0801 (!) 199/95 97.8 F (36.6 C) Oral 73 16 100 % - -       Recent laboratory studies: Recent Labs    09/06/18 1200 09/13/18 0357  WBC 7.8 12.5*  HGB 13.1 10.9*  HCT 42.5 34.7*  PLT 242 209   Recent Labs    09/06/18 1200 09/13/18 0357  NA 140 140  K 4.1 3.9  CL 103 106  CO2 29 26  BUN 14 10  CREATININE 0.76 0.64  GLUCOSE 103* 128*  CALCIUM 9.1 8.3*   No results found for: INR, PROTIME   Recent Radiographic Studies :  No results found.  DISCHARGE INSTRUCTIONS: Discharge Instructions    Call MD / Call 911   Complete by:  As directed    If you experience chest pain or shortness of breath, CALL 911 and be transported to the hospital emergency room.  If you develope a fever above 101 F, pus (white drainage) or increased drainage or redness at the wound, or calf pain, call your surgeon's office.   Constipation Prevention   Complete by:  As directed    Drink plenty of fluids.  Prune juice may be helpful.  You may use a stool softener, such as Colace (over the counter) 100 mg twice a day.  Use MiraLax (over the counter) for constipation as needed.   Diet - low sodium heart healthy   Complete by:  As directed    Discharge instructions   Complete  by:  As directed    INSTRUCTIONS AFTER JOINT REPLACEMENT   Remove items at home which could result in a fall. This includes throw rugs or furniture in walking pathways ICE to the affected joint every three hours while awake for 30 minutes at a time, for at least the first 3-5 days, and then as needed for pain and swelling.  Continue to use ice for pain and swelling. You may notice swelling that will progress down to the foot and ankle.  This is normal after  surgery.  Elevate your leg when you are not up walking on it.   Continue to use the breathing machine you got in the hospital (incentive spirometer) which will help keep your temperature down.  It is common for your temperature to cycle up and down following surgery, especially at night when you are not up moving around and exerting yourself.  The breathing machine keeps your lungs expanded and your temperature down.   DIET:  As you were doing prior to hospitalization, we recommend a well-balanced diet.  DRESSING / WOUND CARE / SHOWERING  Keep the surgical dressing until follow up.  The dressing is water proof, so you can shower without any extra covering.  IF THE DRESSING FALLS OFF or the wound gets wet inside, change the dressing with sterile gauze.  Please use good hand washing techniques before changing the dressing.  Do not use any lotions or creams on the incision until instructed by your surgeon.    ACTIVITY  Increase activity slowly as tolerated, but follow the weight bearing instructions below.   No driving for 6 weeks or until further direction given by your physician.  You cannot drive while taking narcotics.  No lifting or carrying greater than 10 lbs. until further directed by your surgeon. Avoid periods of inactivity such as sitting longer than an hour when not asleep. This helps prevent blood clots.  You may return to work once you are authorized by your doctor.     WEIGHT BEARING   Weight bearing as tolerated with assist device (walker, cane, etc) as directed, use it as long as suggested by your surgeon or therapist, typically at least 4-6 weeks.   EXERCISES  Results after joint replacement surgery are often greatly improved when you follow the exercise, range of motion and muscle strengthening exercises prescribed by your doctor. Safety measures are also important to protect the joint from further injury. Any time any of these exercises cause you to have increased pain or  swelling, decrease what you are doing until you are comfortable again and then slowly increase them. If you have problems or questions, call your caregiver or physical therapist for advice.   Rehabilitation is important following a joint replacement. After just a few days of immobilization, the muscles of the leg can become weakened and shrink (atrophy).  These exercises are designed to build up the tone and strength of the thigh and leg muscles and to improve motion. Often times heat used for twenty to thirty minutes before working out will loosen up your tissues and help with improving the range of motion but do not use heat for the first two weeks following surgery (sometimes heat can increase post-operative swelling).   These exercises can be done on a training (exercise) mat, on the floor, on a table or on a bed. Use whatever works the best and is most comfortable for you.    Use music or television while you are exercising so that  the exercises are a pleasant break in your day. This will make your life better with the exercises acting as a break in your routine that you can look forward to.   Perform all exercises about fifteen times, three times per day or as directed.  You should exercise both the operative leg and the other leg as well.   Exercises include:   Quad Sets - Tighten up the muscle on the front of the thigh (Quad) and hold for 5-10 seconds.   Straight Leg Raises - With your knee straight (if you were given a brace, keep it on), lift the leg to 60 degrees, hold for 3 seconds, and slowly lower the leg.  Perform this exercise against resistance later as your leg gets stronger.  Leg Slides: Lying on your back, slowly slide your foot toward your buttocks, bending your knee up off the floor (only go as far as is comfortable). Then slowly slide your foot back down until your leg is flat on the floor again.  Angel Wings: Lying on your back spread your legs to the side as far apart as you can  without causing discomfort.  Hamstring Strength:  Lying on your back, push your heel against the floor with your leg straight by tightening up the muscles of your buttocks.  Repeat, but this time bend your knee to a comfortable angle, and push your heel against the floor.  You may put a pillow under the heel to make it more comfortable if necessary.   A rehabilitation program following joint replacement surgery can speed recovery and prevent re-injury in the future due to weakened muscles. Contact your doctor or a physical therapist for more information on knee rehabilitation.    CONSTIPATION  Constipation is defined medically as fewer than three stools per week and severe constipation as less than one stool per week.  Even if you have a regular bowel pattern at home, your normal regimen is likely to be disrupted due to multiple reasons following surgery.  Combination of anesthesia, postoperative narcotics, change in appetite and fluid intake all can affect your bowels.   YOU MUST use at least one of the following options; they are listed in order of increasing strength to get the job done.  They are all available over the counter, and you may need to use some, POSSIBLY even all of these options:    Drink plenty of fluids (prune juice may be helpful) and high fiber foods Colace 100 mg by mouth twice a day  Senokot for constipation as directed and as needed Dulcolax (bisacodyl), take with full glass of water  Miralax (polyethylene glycol) once or twice a day as needed.  If you have tried all these things and are unable to have a bowel movement in the first 3-4 days after surgery call either your surgeon or your primary doctor.    If you experience loose stools or diarrhea, hold the medications until you stool forms back up.  If your symptoms do not get better within 1 week or if they get worse, check with your doctor.  If you experience "the worst abdominal pain ever" or develop nausea or vomiting,  please contact the office immediately for further recommendations for treatment.   ITCHING:  If you experience itching with your medications, try taking only a single pain pill, or even half a pain pill at a time.  You can also use Benadryl over the counter for itching or also to help with sleep.  TED HOSE STOCKINGS:  Use stockings on both legs until for at least 2 weeks or as directed by physician office. They may be removed at night for sleeping.  MEDICATIONS:  See your medication summary on the "After Visit Summary" that nursing will review with you.  You may have some home medications which will be placed on hold until you complete the course of blood thinner medication.  It is important for you to complete the blood thinner medication as prescribed.  PRECAUTIONS:  If you experience chest pain or shortness of breath - call 911 immediately for transfer to the hospital emergency department.   If you develop a fever greater that 101 F, purulent drainage from wound, increased redness or drainage from wound, foul odor from the wound/dressing, or calf pain - CONTACT YOUR SURGEON.                                                   FOLLOW-UP APPOINTMENTS:  If you do not already have a post-op appointment, please call the office for an appointment to be seen by your surgeon.  Guidelines for how soon to be seen are listed in your "After Visit Summary", but are typically between 1-4 weeks after surgery.  OTHER INSTRUCTIONS:   Knee Replacement:  Do not place pillow under knee, focus on keeping the knee straight while resting. CPM instructions: 0-90 degrees, 2 hours in the morning, 2 hours in the afternoon, and 2 hours in the evening. Place foam block, curve side up under heel at all times except when in CPM or when walking.  DO NOT modify, tear, cut, or change the foam block in any way.  MAKE SURE YOU:  Understand these instructions.  Get help right away if you are not doing well or get worse.     Thank you for letting us be a part of your medical care team.  It is a privilege we respect greatly.  We hope these instructions will help you stay on track for a fast and full recovery!   Increase activity slowly as tolerated   Complete by:  As directed       DISCHARGE MEDICATIONS:   Allergies as of 09/13/2018      Reactions   Dilaudid [hydromorphone] Itching   Hydrocodone-acetaminophen Nausea And Vomiting   Nsaids    GI Issues   Tramadol Hcl Nausea And Vomiting   Prednisone Rash      Medication List    STOP taking these medications   aspirin EC 81 MG tablet     TAKE these medications   acetaminophen 500 MG tablet Commonly known as:  TYLENOL Take 500 mg by mouth every 6 (six) hours as needed for moderate pain.   apixaban 2.5 MG Tabs tablet Commonly known as:  ELIQUIS Take 1 tablet (2.5 mg total) by mouth every 12 (twelve) hours.   ferrous sulfate 325 (65 FE) MG EC tablet Take 325 mg by mouth daily with breakfast.   gabapentin 300 MG capsule Commonly known as:  NEURONTIN Take 300 mg by mouth 2 (two) times daily.   losartan 100 MG tablet Commonly known as:  COZAAR Take 100 mg by mouth daily.   methocarbamol 500 MG tablet Commonly known as:  ROBAXIN Take 1-2 tablets (500-1,000 mg total) by mouth every 6 (six) hours as needed for muscle spasms.  oxyCODONE 5 MG immediate release tablet Commonly known as:  Oxy IR/ROXICODONE Take 1-2 tablets (5-10 mg total) by mouth every 6 (six) hours as needed for moderate pain (pain score 4-6).   pantoprazole 40 MG tablet Commonly known as:  PROTONIX Take 40 mg by mouth 2 (two) times daily.            Durable Medical Equipment  (From admission, onward)         Start     Ordered   09/12/18 1454  DME Walker rolling  Once    Question:  Patient needs a walker to treat with the following condition  Answer:  S/P total knee replacement   09/12/18 1453   09/12/18 1454  DME 3 n 1  Once     09/12/18 1453   09/12/18 1454   DME Bedside commode  Once    Question:  Patient needs a bedside commode to treat with the following condition  Answer:  S/P total knee replacement   09/12/18 1453          FOLLOW UP VISIT:    DISPOSITION: HOME VS. SNF  CONDITION:  Good   Guy SandiferColby Alan Senora Lacson 09/13/2018, 7:17 AM

## 2018-09-13 NOTE — Progress Notes (Signed)
Physical Therapy Treatment Patient Details Name: Kendra [name] MRN: [id number] DOB: 20-Sep-1950 Today's Date: 09/13/2018    History of Present Illness RTKA    PT Comments    Patient is progressing. Will practice steps , then to DC.   Follow Up Recommendations  Follow surgeon's recommendation for DC plan and follow-up therapies;Home health PT     Equipment Recommendations  None recommended by PT    Recommendations for Other Services       Precautions / Restrictions Precautions Precautions: Knee;Fall    Mobility  Bed Mobility               General bed mobility comments: OOB  Transfers Overall transfer level: Needs assistance Equipment used: Rolling walker (2 wheeled) Transfers: Sit to/from Stand Sit to Stand: Min assist         General transfer comment: multimodal cues for sequence and position inside RW.  Ambulation/Gait Ambulation/Gait assistance: Min assist Gait Distance (Feet): 200 Feet Assistive device: Rolling walker (2 wheeled) Gait Pattern/deviations: Step-to pattern;Step-through pattern     General Gait Details: cues for sequence, to slow down   Stairs             Wheelchair Mobility    Modified Rankin (Stroke Patients Only)       Balance                                            Cognition Arousal/Alertness: Awake/alert Behavior During Therapy: WFL for tasks assessed/performed                                          Exercises Total Joint Exercises Ankle Circles/Pumps: AROM;Both;10 reps Quad Sets: AROM;Both;10 reps Heel Slides: AAROM;Right;10 reps Hip ABduction/ADduction: AAROM;10 reps;Right Straight Leg Raises: AAROM;10 reps;Right Goniometric ROM: 5-60 left knee flexion    General Comments        Pertinent Vitals/Pain Pain Score: 3  Pain Location: right knee Pain Descriptors / Indicators: Aching;Discomfort Pain Intervention(s): Premedicated before session;Monitored during  session    Home Living                      Prior Function            PT Goals (current goals can now be found in the care plan section) Progress towards PT goals: Progressing toward goals    Frequency           PT Plan Current plan remains appropriate    Co-evaluation              AM-PAC PT "6 Clicks" Mobility   Outcome Measure  Help needed turning from your back to your side while in a flat bed without using bedrails?: A Little Help needed moving from lying on your back to sitting on the side of a flat bed without using bedrails?: A Little Help needed moving to and from a bed to a chair (including a wheelchair)?: A Little Help needed standing up from a chair using your arms (e.g., wheelchair or bedside chair)?: A Little Help needed to walk in hospital room?: A Little Help needed climbing 3-5 steps with a railing? : A Lot 6 Click Score: 17    End of Session Equipment Utilized During Treatment: Gait belt Activity  Tolerance: Patient tolerated treatment well Patient left: in chair;with call bell/phone within reach;with family/visitor present Nurse Communication: Mobility status PT Visit Diagnosis: Unsteadiness on feet (R26.81);Pain Pain - Right/Left: Right Pain - part of body: Knee     Time: 1039-1100 PT Time Calculation (min) (ACUTE ONLY): 21 min  Charges:  $Gait Training: 8-22 mins                     Blanchard Kelch PT Acute Rehabilitation Services Pager [phone] Office [phone]    Rada Hay 09/13/2018, 12:41 PM

## 2018-09-13 NOTE — Care Management Note (Signed)
Case Management Note  Patient Details  Name: Kendra [name] MRN: [id number] Date of Birth: Mar 31, 1951  Subjective/Objective:       Discharge planning, spoke with patient and daughter at bedside. Have chosen [facility] for [facility] PT, evaluate and treat.              Action/Plan: Contacted [facility] for referral. They have accepted. Has DME. [phone]  Expected Discharge Date:  09/13/18               Expected Discharge Plan:  Home w Home Health Services  In-House Referral:  NA  Discharge planning Services  CM Consult  Post Acute Care Choice:  Home Health Choice offered to:  Patient  DME Arranged:  N/A DME Agency:  NA  HH Arranged:  PT HH Agency:  [facility] (formerly [facility]  Status of Service:  Completed, signed off  If discussed at [employer], dates discussed:    Additional Comments:  Alexis Goodell, RN 09/13/2018, 11:42 AM

## 2018-09-13 NOTE — Progress Notes (Signed)
Physical Therapy Treatment Patient Details Name: Kendra [name] MRN: [id number] DOB: [birth year] Today's Date: 09/13/2018    History of Present Illness RTKA    PT Comments    Patient is ready for DC.   Follow Up Recommendations  Follow surgeon's recommendation for DC plan and follow-up therapies;Home health PT     Equipment Recommendations  None recommended by PT    Recommendations for Other Services       Precautions / Restrictions Precautions Precautions: None    Mobility  Bed Mobility               General bed mobility comments: OOB  Transfers Overall transfer level: Needs assistance Equipment used: Rolling walker (2 wheeled) Transfers: Sit to/from Stand Sit to Stand: Min assist         General transfer comment: multimodal cues for sequence and position inside RW.  Ambulation/Gait Ambulation/Gait assistance: Min assist Gait Distance (Feet): 80 Feet Assistive device: Rolling walker (2 wheeled) Gait Pattern/deviations: Step-to pattern;Step-through pattern     General Gait Details: cues for sequence, to slow down   Stairs Stairs: Yes Stairs assistance: Mod assist     General stair comments: with cane and rail up 2 x 2 with mod assist. then backwards with Rw and daughter present which was much safer.    Wheelchair Mobility    Modified Rankin (Stroke Patients Only)       Balance                                            Cognition Arousal/Alertness: Awake/alert Behavior During Therapy: WFL for tasks assessed/performed                                          Exercises Total Joint Exercises Ankle Circles/Pumps: AROM;Both;10 reps Quad Sets: AROM;Both;10 reps Heel Slides: AAROM;Right;10 reps Hip ABduction/ADduction: AAROM;10 reps;Right Straight Leg Raises: AAROM;10 reps;Right Goniometric ROM: 5-60 left knee flexion    General Comments        Pertinent Vitals/Pain Pain Score: 3  Pain  Location: right knee Pain Descriptors / Indicators: Aching;Discomfort Pain Intervention(s): Monitored during session;Premedicated before session    Home Living                      Prior Function            PT Goals (current goals can now be found in the care plan section) Progress towards PT goals: Progressing toward goals    Frequency    7X/week      PT Plan Current plan remains appropriate    Co-evaluation              AM-PAC PT "6 Clicks" Mobility   Outcome Measure  Help needed turning from your back to your side while in a flat bed without using bedrails?: A Little Help needed moving from lying on your back to sitting on the side of a flat bed without using bedrails?: A Little Help needed moving to and from a bed to a chair (including a wheelchair)?: A Little Help needed standing up from a chair using your arms (e.g., wheelchair or bedside chair)?: A Little Help needed to walk in hospital room?: A Little Help needed climbing 3-5 steps with a railing? :  A Lot 6 Click Score: 14    End of Session Equipment Utilized During Treatment: Gait belt Activity Tolerance: Patient tolerated treatment well Patient left: in chair;with call bell/phone within reach;with family/visitor present Nurse Communication: Mobility status PT Visit Diagnosis: Unsteadiness on feet (R26.81);Pain Pain - Right/Left: Right Pain - part of body: Knee     Time: [id number] PT Time Calculation (min) (ACUTE ONLY): 16 min  Charges:  $Gait Training: 8-22 mins                     Kendra [name] PT Acute Rehabilitation Services Pager [phone] Office [phone]    Kendra [name], Kendra [name] 09/13/2018, 3:35 PM

## 2018-09-13 NOTE — Progress Notes (Signed)
SPORTS MEDICINE AND JOINT REPLACEMENT  Georgena SpurlingStephen Lucey, MD    Laurier Nancyolby Lenard Kampf, PA-C [address], [place], [place]  [id number]                             [phone]   PROGRESS NOTE  Subjective:  negative for Chest Pain  negative for Shortness of Breath  negative for Nausea/Vomiting   negative for Calf Pain  negative for Bowel Movement   Tolerating Diet: yes         Patient reports pain as 3 on 0-10 scale.    Objective: Vital signs in last 24 hours:    Patient Vitals for the past 24 hrs:  BP Temp Temp src Pulse Resp SpO2 Height Weight  09/13/18 0458 139/70 98 F (36.7 C) Oral 67 16 99 % - -  09/13/18 0121 130/66 97.8 F (36.6 C) Oral 68 16 97 % - -  09/12/18 2040 (!) 145/76 97.9 F (36.6 C) Oral 74 16 98 % - -  09/12/18 1925 (!) 152/68 97.9 F (36.6 C) - 68 16 94 % - -  09/12/18 1826 (!) 159/84 97.6 F (36.4 C) - 79 16 90 % - -  09/12/18 1726 (!) 169/81 97.8 F (36.6 C) Oral 74 15 99 % - -  09/12/18 1636 (!) 162/90 (!) 97.5 F (36.4 C) - 76 16 97 % 5\' 5"  (1.651 m) 102.1 kg  09/12/18 1530 (!) 172/83 (!) 97.5 F (36.4 C) - 74 16 97 % - -  09/12/18 1430 (!) 170/79 (!) 97.5 F (36.4 C) - 62 16 100 % - -  09/12/18 1415 (!) 143/77 (!) 97.4 F (36.3 C) - 60 15 100 % - -  09/12/18 1400 (!) 155/81 (!) 97.4 F (36.3 C) - 63 15 98 % - -  09/12/18 1345 (!) 157/71 (!) 97.4 F (36.3 C) - 62 17 100 % - -  09/12/18 1330 (!) 145/71 (!) 97.4 F (36.3 C) - (!) 58 12 100 % - -  09/12/18 1315 138/73 - - 61 13 100 % - -  09/12/18 1300 129/70 - - 67 20 100 % - -  09/12/18 1252 125/66 - - 69 14 94 % - -  09/12/18 1030 120/73 - - 66 13 100 % - -  09/12/18 1028 - - - 66 16 100 % - -  09/12/18 1027 - - - 67 18 100 % - -  09/12/18 1026 - - - 69 20 100 % - -  09/12/18 1025 - - - 64 13 100 % - -  09/12/18 1024 (!) 158/74 - - 72 - 100 % - -  09/12/18 1023 - - - 67 - 99 % - -  09/12/18 1022 - - - 70 - 97 % - -  09/12/18 1021 - - - 77 - 97 % - -  09/12/18 1020 - - - 76 12 98 % - -   09/12/18 1019 - - - 74 - 100 % - -  09/12/18 1018 (!) 173/89 - - - - - - -  09/12/18 1015 (!) 167/91 - - 74 13 100 % - -  09/12/18 0846 (!) 175/90 - - - - - - -  09/12/18 0801 (!) 199/95 97.8 F (36.6 C) Oral 73 16 100 % - -    @flow {1959:LAST@   Intake/Output from previous day:   01/06 0701 - 01/07 0700 In: 3805.5 [P.O.:1080; I.V.:2388.8] Out: 2200 [  Urine:2150]   Intake/Output this shift:   No intake/output data recorded.   Intake/Output      01/06 0701 - 01/07 0700 01/07 0701 - 01/08 0700   P.O. 1080    I.V. (mL/kg) 2388.8 (23.4)    IV Piggyback 336.7    Total Intake(mL/kg) 3805.5 (37.3)    Urine (mL/kg/hr) 2150    Stool 0    Blood 50    Total Output 2200    Net +1605.5         Stool Occurrence 0 x       LABORATORY DATA: Recent Labs    09/06/18 1200 09/13/18 0357  WBC 7.8 12.5*  HGB 13.1 10.9*  HCT 42.5 34.7*  PLT 242 209   Recent Labs    09/06/18 1200 09/13/18 0357  NA 140 140  K 4.1 3.9  CL 103 106  CO2 29 26  BUN 14 10  CREATININE 0.76 0.64  GLUCOSE 103* 128*  CALCIUM 9.1 8.3*   No results found for: INR, PROTIME  Examination:  General appearance: alert, cooperative and no distress Extremities: extremities normal, atraumatic, no cyanosis or edema  Wound Exam: clean, dry, intact   Drainage:  None: wound tissue dry  Motor Exam: Quadriceps and Hamstrings Intact  Sensory Exam: Superficial Peroneal, Deep Peroneal and Tibial normal   Assessment:    1 Day Post-Op  Procedure(s) (LRB): TOTAL KNEE ARTHROPLASTY (Right)  ADDITIONAL DIAGNOSIS:  Active Problems:   S/P total knee replacement     Plan: Physical Therapy as ordered Weight Bearing as Tolerated (WBAT)  DVT Prophylaxis:  Eliquis  DISCHARGE PLAN: Home  DISCHARGE NEEDS: HHPT     Patient doing well, D/C home today  Patient's anticipated LOS is less than 2 midnights, meeting these requirements: - Lives within 1 hour of care - Has a competent adult at home to recover with  post-op recover - NO history of  - Chronic pain requiring opiods  - Diabetes  - Coronary Artery Disease  - Heart failure  - Heart attack  - Stroke  - DVT/VTE  - Cardiac arrhythmia  - Respiratory Failure/COPD  - Renal failure  - Anemia  - Advanced Liver disease        Guy SandiferColby Alan Decari Duggar 09/13/2018, 7:14 AM

## 2018-09-14 NOTE — Op Note (Signed)
TOTAL KNEE REPLACEMENT OPERATIVE NOTE:  09/12/2018  2:24 PM  PATIENT:  Kendra [name]  68 y.o. female  PRE-OPERATIVE DIAGNOSIS:  RT. KNEE OSTEOARTHRITIS  POST-OPERATIVE DIAGNOSIS:  RT. KNEE OSTEOARTHRITIS  PROCEDURE:  Procedure(s): TOTAL KNEE ARTHROPLASTY  SURGEON:  Surgeon(s): Dannielle HuhLucey, Karrina Lye, MD  PHYSICIAN ASSISTANT:  Skip MayerBlair Roberts, PA-C  ANESTHESIA:   spinal  SPECIMEN: None  COUNTS:  Correct  TOURNIQUET:   Total Tourniquet Time Documented: Thigh (Right) - 37 minutes Total: Thigh (Right) - 37 minutes   DICTATION:  Indication for procedure:    The patient is a 68 y.o. female who has failed conservative treatment for RT. KNEE OSTEOARTHRITIS.  Informed consent was obtained prior to anesthesia. The risks versus benefits of the operation were explain and in a way the patient can, and did, understand.   On the implant demand matching protocol, this patient scored 8.  Therefore, this patient was not receive a polyethylene insert with vitamin E which is a high demand implant.  Description of procedure:     The patient was taken to the operating room and placed under anesthesia.  The patient was positioned in the usual fashion taking care that all body parts were adequately padded and/or protected.  A tourniquet was applied and the leg prepped and draped in the usual sterile fashion.  The extremity was exsanguinated with the esmarch and tourniquet inflated to 350 mmHg.  Pre-operative range of motion was normal.  The knee was in 5 degree of mild varus.  A midline incision approximately 6-7 inches long was made with a #10 blade.  A new blade was used to make a parapatellar arthrotomy going 2-3 cm into the quadriceps tendon, over the patella, and alongside the medial aspect of the patellar tendon.  A synovectomy was then performed with the #10 blade and forceps. I then elevated the deep MCL off the medial tibial metaphysis subperiosteally around to the semimembranosus attachment.     I everted the patella and used calipers to measure patellar thickness.  I used the reamer to ream down to appropriate thickness to recreate the native thickness.  I then removed excess bone with the rongeur and sagittal saw.  I used the appropriately sized template and drilled the three lug holes.  I then put the trial in place and measured the thickness with the calipers to ensure recreation of the native thickness.  The trial was then removed and the patella subluxed and the knee brought into flexion.  A homan retractor was place to retract and protect the patella and lateral structures.  A Z-retractor was place medially to protect the medial structures.  The extra-medullary alignment system was used to make cut the tibial articular surface perpendicular to the anamotic axis of the tibia and in 3 degrees of posterior slope.  The cut surface and alignment jig was removed.  I then used the intramedullary alignment guide to make a 6 valgus cut on the distal femur.  I then marked out the epicondylar axis on the distal femur.  The posterior condylar axis measured 3 degrees.  I then used the anterior referencing sizer and measured the femur to be a size 7.  The 4-In-1 cutting block was screwed into place in external rotation matching the posterior condylar angle, making our cuts perpendicular to the epicondylar axis.  Anterior, posterior and chamfer cuts were made with the sagittal saw.  The cutting block and cut pieces were removed.  A lamina spreader was placed in 90 degrees of flexion.  The ACL, PCL, menisci, and posterior condylar osteophytes were removed.  A 11 mm spacer blocked was found to offer good flexion and extension gap balance after mild in degree releasing.   The scoop retractor was then placed and the femoral finishing block was pinned in place.  The small sagittal saw was used as well as the lug drill to finish the femur.  The block and cut surfaces were removed and the medullary canal hole  filled with autograft bone from the cut pieces.  The tibia was delivered forward in deep flexion and external rotation.  A size E tray was selected and pinned into place centered on the medial 1/3 of the tibial tubercle.  The reamer and keel was used to prepare the tibia through the tray.    I then trialed with the size 7 femur, size E tibia, a 11 mm insert and the 35 patella.  I had excellent flexion/extension gap balance, excellent patella tracking.  Flexion was full and beyond 120 degrees; extension was zero.  These components were chosen and the staff opened them to me on the back table while the knee was lavaged copiously and the cement mixed.  The soft tissue was infiltrated with 60cc of exparel 1.3% through a 21 gauge needle.  I cemented in the components and removed all excess cement.  The polyethylene tibial component was snapped into place and the knee placed in extension while cement was hardening.  The capsule was infilltrated with a 60cc exparel/marcaine/saline mixture.   Once the cement was hard, the tourniquet was let down.  Hemostasis was obtained.  The arthrotomy was closed using a #1 stratofix running suture.  The deep soft tissues were closed with #0 vicryls and the subcuticular layer closed with #2-0 vicryl.  The skin was reapproximated and closed with 3.0 Monocryl.  The wound was covered with steristrips, aquacel dressing, and a TED stocking.   The patient was then awakened, extubated, and taken to the recovery room in stable condition.  BLOOD LOSS:  300cc COMPLICATIONS:  None.  PLAN OF CARE: Admit for overnight observation  PATIENT DISPOSITION:  PACU - hemodynamically stable.   Delay start of Pharmacological VTE agent (>24hrs) due to surgical blood loss or risk of bleeding:  not applicable  Please fax a copy of this op note to my office at [phone] (please only include page 1 and 2 of the Case Information op note)

## 2020-04-14 ENCOUNTER — Encounter (HOSPITAL_COMMUNITY): Payer: Self-pay | Admitting: Family Medicine

## 2020-04-14 ENCOUNTER — Inpatient Hospital Stay (HOSPITAL_COMMUNITY): Payer: Medicare PPO | Admitting: Anesthesiology

## 2020-04-14 ENCOUNTER — Encounter (HOSPITAL_COMMUNITY)
Admission: AD | Disposition: A | Discharge status: Skilled Nursing Facility | Payer: Self-pay | Source: Other Acute Inpatient Hospital | Attending: Family Medicine

## 2020-04-14 ENCOUNTER — Other Ambulatory Visit: Payer: Self-pay

## 2020-04-14 ENCOUNTER — Inpatient Hospital Stay (HOSPITAL_COMMUNITY): Payer: Medicare PPO

## 2020-04-14 ENCOUNTER — Inpatient Hospital Stay (HOSPITAL_COMMUNITY)
Admission: AD | Admit: 2020-04-14 | Discharge: 2020-04-17 | DRG: 481 | Disposition: A | Discharge status: Skilled Nursing Facility | Payer: Medicare PPO | Source: Other Acute Inpatient Hospital | Attending: Family Medicine | Admitting: Family Medicine

## 2020-04-14 DIAGNOSIS — M79621 Pain in right upper arm: Secondary | ICD-10-CM | POA: Diagnosis present

## 2020-04-14 DIAGNOSIS — M797 Fibromyalgia: Secondary | ICD-10-CM | POA: Diagnosis present

## 2020-04-14 DIAGNOSIS — Z20822 Contact with and (suspected) exposure to covid-19: Secondary | ICD-10-CM | POA: Diagnosis present

## 2020-04-14 DIAGNOSIS — S72001A Fracture of unspecified part of neck of right femur, initial encounter for closed fracture: Secondary | ICD-10-CM | POA: Diagnosis not present

## 2020-04-14 DIAGNOSIS — T148XXA Other injury of unspecified body region, initial encounter: Secondary | ICD-10-CM

## 2020-04-14 DIAGNOSIS — M1990 Unspecified osteoarthritis, unspecified site: Secondary | ICD-10-CM | POA: Diagnosis present

## 2020-04-14 DIAGNOSIS — Z9049 Acquired absence of other specified parts of digestive tract: Secondary | ICD-10-CM

## 2020-04-14 DIAGNOSIS — I10 Essential (primary) hypertension: Secondary | ICD-10-CM

## 2020-04-14 DIAGNOSIS — Z79899 Other long term (current) drug therapy: Secondary | ICD-10-CM | POA: Diagnosis not present

## 2020-04-14 DIAGNOSIS — Z90710 Acquired absence of both cervix and uterus: Secondary | ICD-10-CM

## 2020-04-14 DIAGNOSIS — Z7901 Long term (current) use of anticoagulants: Secondary | ICD-10-CM | POA: Diagnosis not present

## 2020-04-14 DIAGNOSIS — S42201A Unspecified fracture of upper end of right humerus, initial encounter for closed fracture: Secondary | ICD-10-CM

## 2020-04-14 DIAGNOSIS — S72001D Fracture of unspecified part of neck of right femur, subsequent encounter for closed fracture with routine healing: Secondary | ICD-10-CM

## 2020-04-14 DIAGNOSIS — W19XXXA Unspecified fall, initial encounter: Secondary | ICD-10-CM | POA: Diagnosis present

## 2020-04-14 DIAGNOSIS — S72011A Unspecified intracapsular fracture of right femur, initial encounter for closed fracture: Secondary | ICD-10-CM | POA: Diagnosis present

## 2020-04-14 DIAGNOSIS — Z419 Encounter for procedure for purposes other than remedying health state, unspecified: Secondary | ICD-10-CM

## 2020-04-14 DIAGNOSIS — S42251A Displaced fracture of greater tuberosity of right humerus, initial encounter for closed fracture: Secondary | ICD-10-CM | POA: Diagnosis present

## 2020-04-14 HISTORY — PX: HIP PINNING,CANNULATED: SHX1758

## 2020-04-14 LAB — BASIC METABOLIC PANEL
Anion gap: 12 (ref 5–15)
BUN: 12 mg/dL (ref 8–23)
CO2: 27 mmol/L (ref 22–32)
Calcium: 9.3 mg/dL (ref 8.9–10.3)
Chloride: 102 mmol/L (ref 98–111)
Creatinine, Ser: 0.79 mg/dL (ref 0.44–1.00)
GFR calc Af Amer: >60 mL/min (ref >60)
GFR calc non Af Amer: >60 mL/min (ref >60)
Glucose, Bld: 135 mg/dL — ABNORMAL HIGH (ref 70–99)
Potassium: 3.8 mmol/L (ref 3.5–5.1)
Sodium: 141 mmol/L (ref 135–145)

## 2020-04-14 LAB — CBC
HCT: 38.6 % (ref 36.0–46.0)
Hemoglobin: 12.2 g/dL (ref 12.0–15.0)
MCH: 29.5 pg (ref 26.0–34.0)
MCHC: 31.6 g/dL (ref 30.0–36.0)
MCV: 93.5 fL (ref 80.0–100.0)
Platelets: 238 10*3/uL (ref 150–400)
RBC: 4.13 MIL/uL (ref 3.87–5.11)
RDW: 12.7 % (ref 11.5–15.5)
WBC: 14.3 10*3/uL — ABNORMAL HIGH (ref 4.0–10.5)
nRBC: 0 % (ref 0.0–0.2)

## 2020-04-14 LAB — HIV ANTIBODY (ROUTINE TESTING W REFLEX): HIV Screen 4th Generation wRfx: NONREACTIVE

## 2020-04-14 LAB — SURGICAL PCR SCREEN
MRSA, PCR: NEGATIVE
Staphylococcus aureus: POSITIVE — AB

## 2020-04-14 SURGERY — FIXATION, FEMUR, NECK, PERCUTANEOUS, USING SCREW
Anesthesia: Monitor Anesthesia Care | Site: Hip | Laterality: Right

## 2020-04-14 MED ORDER — METHOCARBAMOL 1000 MG/10ML IJ SOLN
500.0000 mg | Freq: Four times a day (QID) | INTRAVENOUS | Status: DC | PRN
  Filled 2020-04-14: qty 5

## 2020-04-14 MED ORDER — PHENYLEPHRINE 40 MCG/ML (10ML) SYRINGE FOR IV PUSH (FOR BLOOD PRESSURE SUPPORT)
PREFILLED_SYRINGE | INTRAVENOUS | Status: AC
  Filled 2020-04-14: qty 10

## 2020-04-14 MED ORDER — ENOXAPARIN SODIUM 40 MG/0.4ML ~~LOC~~ SOLN: 40.0000 mg | Freq: Every day | SUBCUTANEOUS | 13 refills | Status: DC

## 2020-04-14 MED ORDER — DEXAMETHASONE SODIUM PHOSPHATE 10 MG/ML IJ SOLN
INTRAMUSCULAR | Status: AC
  Filled 2020-04-14: qty 1

## 2020-04-14 MED ORDER — OXYCODONE HCL 5 MG PO TABS: 10.0000 mg | ORAL_TABLET | ORAL | Status: DC | PRN

## 2020-04-14 MED ORDER — MIDAZOLAM HCL 2 MG/2ML IJ SOLN
INTRAMUSCULAR | Status: DC | PRN
  Administered 2020-04-14: 2 mg via INTRAVENOUS

## 2020-04-14 MED ORDER — SORBITOL 70 % SOLN: 30.0000 mL | Freq: Every day | Status: DC | PRN

## 2020-04-14 MED ORDER — DEXAMETHASONE SODIUM PHOSPHATE 10 MG/ML IJ SOLN
INTRAMUSCULAR | Status: DC | PRN
  Administered 2020-04-14: 5 mg via INTRAVENOUS

## 2020-04-14 MED ORDER — OXYCODONE HCL 5 MG PO TABS: 5.0000 mg | ORAL_TABLET | ORAL | Status: DC | PRN

## 2020-04-14 MED ORDER — ENOXAPARIN SODIUM 40 MG/0.4ML ~~LOC~~ SOLN
40.0000 mg | SUBCUTANEOUS | Status: DC
  Administered 2020-04-15 – 2020-04-17 (×3): 40 mg via SUBCUTANEOUS
  Filled 2020-04-14 (×3): qty 0.4

## 2020-04-14 MED ORDER — PHENOL 1.4 % MT LIQD: 1.0000 | OROMUCOSAL | Status: DC | PRN

## 2020-04-14 MED ORDER — 0.9 % SODIUM CHLORIDE (POUR BTL) OPTIME
TOPICAL | Status: DC | PRN
  Administered 2020-04-14: 1000 mL

## 2020-04-14 MED ORDER — CEFAZOLIN SODIUM-DEXTROSE 2-4 GM/100ML-% IV SOLN
2.0000 g | Freq: Three times a day (TID) | INTRAVENOUS | Status: AC
  Administered 2020-04-14 – 2020-04-15 (×3): 2 g via INTRAVENOUS
  Filled 2020-04-14 (×4): qty 100

## 2020-04-14 MED ORDER — MIDAZOLAM HCL 2 MG/2ML IJ SOLN
INTRAMUSCULAR | Status: AC
  Filled 2020-04-14: qty 2

## 2020-04-14 MED ORDER — CEFAZOLIN SODIUM-DEXTROSE 2-3 GM-%(50ML) IV SOLR
INTRAVENOUS | Status: DC | PRN
Start: 2020-04-14 — End: 2020-04-14
  Administered 2020-04-14: 2 g via INTRAVENOUS

## 2020-04-14 MED ORDER — FENTANYL CITRATE (PF) 250 MCG/5ML IJ SOLN
INTRAMUSCULAR | Status: AC
  Filled 2020-04-14: qty 5

## 2020-04-14 MED ORDER — OXYCODONE-ACETAMINOPHEN 5-325 MG PO TABS: 1.0000 | ORAL_TABLET | Freq: Three times a day (TID) | ORAL | 0 refills | Status: DC | PRN

## 2020-04-14 MED ORDER — PROPOFOL 10 MG/ML IV BOLUS
INTRAVENOUS | Status: DC | PRN
  Administered 2020-04-14: 40 mg via INTRAVENOUS

## 2020-04-14 MED ORDER — ALUM & MAG HYDROXIDE-SIMETH 200-200-20 MG/5ML PO SUSP: 30.0000 mL | ORAL | Status: DC | PRN

## 2020-04-14 MED ORDER — GABAPENTIN 400 MG PO CAPS
400.0000 mg | ORAL_CAPSULE | Freq: Three times a day (TID) | ORAL | Status: DC
  Administered 2020-04-14 – 2020-04-17 (×12): 400 mg via ORAL
  Filled 2020-04-14 (×12): qty 1

## 2020-04-14 MED ORDER — CHLORHEXIDINE GLUCONATE 0.12 % MT SOLN
OROMUCOSAL | Status: AC
  Administered 2020-04-14: 15 mL via OROMUCOSAL
  Filled 2020-04-14: qty 15

## 2020-04-14 MED ORDER — DEXTROSE 5 % IV SOLN: 3.0000 g | Freq: Three times a day (TID) | INTRAVENOUS | Status: DC

## 2020-04-14 MED ORDER — MENTHOL 3 MG MT LOZG: 1.0000 | LOZENGE | OROMUCOSAL | Status: DC | PRN

## 2020-04-14 MED ORDER — LACTATED RINGERS IV SOLN: INTRAVENOUS | Status: DC | PRN

## 2020-04-14 MED ORDER — SENNOSIDES-DOCUSATE SODIUM 8.6-50 MG PO TABS: 1.0000 | ORAL_TABLET | Freq: Every evening | ORAL | Status: DC | PRN

## 2020-04-14 MED ORDER — ONDANSETRON HCL 4 MG PO TABS: 4.0000 mg | ORAL_TABLET | Freq: Four times a day (QID) | ORAL | Status: DC | PRN

## 2020-04-14 MED ORDER — SODIUM CHLORIDE 0.9 % IV SOLN: INTRAVENOUS | Status: DC

## 2020-04-14 MED ORDER — FENTANYL CITRATE (PF) 250 MCG/5ML IJ SOLN
INTRAMUSCULAR | Status: DC | PRN
  Administered 2020-04-14 (×2): 50 ug via INTRAVENOUS

## 2020-04-14 MED ORDER — PANTOPRAZOLE SODIUM 40 MG PO TBEC
40.0000 mg | DELAYED_RELEASE_TABLET | Freq: Two times a day (BID) | ORAL | Status: DC
  Administered 2020-04-14 – 2020-04-17 (×7): 40 mg via ORAL
  Filled 2020-04-14 (×7): qty 1

## 2020-04-14 MED ORDER — ACETAMINOPHEN 500 MG PO TABS
1000.0000 mg | ORAL_TABLET | Freq: Four times a day (QID) | ORAL | Status: AC
  Administered 2020-04-14 – 2020-04-15 (×3): 1000 mg via ORAL
  Filled 2020-04-14 (×2): qty 2

## 2020-04-14 MED ORDER — HYDROMORPHONE HCL 1 MG/ML IJ SOLN: 0.5000 mg | INTRAMUSCULAR | Status: DC | PRN

## 2020-04-14 MED ORDER — POLYETHYLENE GLYCOL 3350 17 G PO PACK: 17.0000 g | PACK | Freq: Every day | ORAL | Status: DC | PRN

## 2020-04-14 MED ORDER — PHENYLEPHRINE HCL (PRESSORS) 10 MG/ML IV SOLN
INTRAVENOUS | Status: DC | PRN
  Administered 2020-04-14: 160 ug via INTRAVENOUS
  Administered 2020-04-14: 120 ug via INTRAVENOUS

## 2020-04-14 MED ORDER — ONDANSETRON HCL 4 MG/2ML IJ SOLN
INTRAMUSCULAR | Status: AC
  Filled 2020-04-14: qty 2

## 2020-04-14 MED ORDER — PROPOFOL 500 MG/50ML IV EMUL
INTRAVENOUS | Status: DC | PRN
  Administered 2020-04-14: 100 ug/kg/min via INTRAVENOUS

## 2020-04-14 MED ORDER — FERROUS SULFATE 325 (65 FE) MG PO TABS
325.0000 mg | ORAL_TABLET | Freq: Every day | ORAL | Status: DC
  Administered 2020-04-15 – 2020-04-17 (×3): 325 mg via ORAL
  Filled 2020-04-14 (×3): qty 1

## 2020-04-14 MED ORDER — ONDANSETRON HCL 4 MG/2ML IJ SOLN: 4.0000 mg | Freq: Four times a day (QID) | INTRAMUSCULAR | Status: DC | PRN

## 2020-04-14 MED ORDER — ONDANSETRON HCL 4 MG/2ML IJ SOLN
INTRAMUSCULAR | Status: DC | PRN
  Administered 2020-04-14: 4 mg via INTRAVENOUS

## 2020-04-14 MED ORDER — CEFAZOLIN SODIUM-DEXTROSE 2-4 GM/100ML-% IV SOLN
INTRAVENOUS | Status: AC
  Filled 2020-04-14: qty 100

## 2020-04-14 MED ORDER — MORPHINE SULFATE (PF) 2 MG/ML IV SOLN: 2.0000 mg | INTRAVENOUS | Status: DC | PRN

## 2020-04-14 MED ORDER — OXYCODONE HCL ER 10 MG PO T12A
10.0000 mg | EXTENDED_RELEASE_TABLET | Freq: Two times a day (BID) | ORAL | Status: DC
  Administered 2020-04-14 – 2020-04-17 (×7): 10 mg via ORAL
  Filled 2020-04-14 (×7): qty 1

## 2020-04-14 MED ORDER — EPHEDRINE 5 MG/ML INJ
INTRAVENOUS | Status: AC
  Filled 2020-04-14: qty 10

## 2020-04-14 MED ORDER — ACETAMINOPHEN 325 MG PO TABS
325.0000 mg | ORAL_TABLET | Freq: Four times a day (QID) | ORAL | Status: DC | PRN
  Filled 2020-04-14 (×2): qty 2

## 2020-04-14 MED ORDER — PHENYLEPHRINE HCL-NACL 10-0.9 MG/250ML-% IV SOLN
INTRAVENOUS | Status: DC | PRN
  Administered 2020-04-14: 25 ug/min via INTRAVENOUS

## 2020-04-14 MED ORDER — MUPIROCIN 2 % EX OINT
1.0000 "application " | TOPICAL_OINTMENT | Freq: Two times a day (BID) | CUTANEOUS | Status: DC
  Administered 2020-04-14 – 2020-04-17 (×8): 1 via NASAL
  Filled 2020-04-14 (×4): qty 22

## 2020-04-14 MED ORDER — DOCUSATE SODIUM 100 MG PO CAPS
100.0000 mg | ORAL_CAPSULE | Freq: Two times a day (BID) | ORAL | Status: DC
  Administered 2020-04-14 – 2020-04-17 (×7): 100 mg via ORAL
  Filled 2020-04-14 (×7): qty 1

## 2020-04-14 MED ORDER — OXYCODONE HCL 5 MG PO TABS
5.0000 mg | ORAL_TABLET | ORAL | Status: DC | PRN
  Administered 2020-04-14 – 2020-04-17 (×4): 5 mg via ORAL
  Filled 2020-04-14 (×2): qty 1
  Filled 2020-04-14: qty 2
  Filled 2020-04-14: qty 1

## 2020-04-14 MED ORDER — ACETAMINOPHEN 325 MG PO TABS
650.0000 mg | ORAL_TABLET | Freq: Four times a day (QID) | ORAL | Status: DC | PRN
  Administered 2020-04-14 – 2020-04-17 (×4): 650 mg via ORAL
  Filled 2020-04-14 (×2): qty 2

## 2020-04-14 MED ORDER — METHOCARBAMOL 500 MG PO TABS: 500.0000 mg | ORAL_TABLET | Freq: Four times a day (QID) | ORAL | Status: DC | PRN

## 2020-04-14 MED ORDER — CHLORHEXIDINE GLUCONATE CLOTH 2 % EX PADS
6.0000 | MEDICATED_PAD | Freq: Every day | CUTANEOUS | Status: DC
  Administered 2020-04-15 – 2020-04-17 (×3): 6 via TOPICAL

## 2020-04-14 MED ORDER — LOSARTAN POTASSIUM 50 MG PO TABS
100.0000 mg | ORAL_TABLET | Freq: Every day | ORAL | Status: DC
  Administered 2020-04-14 – 2020-04-17 (×4): 100 mg via ORAL
  Filled 2020-04-14 (×4): qty 2

## 2020-04-14 MED ORDER — CHLORHEXIDINE GLUCONATE 0.12 % MT SOLN
15.0000 mL | Freq: Once | OROMUCOSAL | Status: AC
  Filled 2020-04-14: qty 15

## 2020-04-14 MED ORDER — PROPOFOL 10 MG/ML IV BOLUS
INTRAVENOUS | Status: AC
  Filled 2020-04-14: qty 20

## 2020-04-14 MED ORDER — EPHEDRINE SULFATE 50 MG/ML IJ SOLN
INTRAMUSCULAR | Status: DC | PRN
  Administered 2020-04-14: 10 mg via INTRAVENOUS

## 2020-04-14 MED ORDER — MAGNESIUM CITRATE PO SOLN: 1.0000 | Freq: Once | ORAL | Status: DC | PRN

## 2020-04-14 MED ORDER — DIPHENHYDRAMINE HCL 50 MG/ML IJ SOLN
INTRAMUSCULAR | Status: AC
  Filled 2020-04-14: qty 1

## 2020-04-14 SURGICAL SUPPLY — 41 items
BIT DRILL 5.0 QC 6.5 (BIT) ×2 IMPLANT
BIT DRILL 5.0 QC 6.5MM (BIT) ×1
BNDG COHESIVE 4X5 TAN STRL (GAUZE/BANDAGES/DRESSINGS) ×3 IMPLANT
BNDG GAUZE ELAST 4 BULKY (GAUZE/BANDAGES/DRESSINGS) ×3 IMPLANT
COVER PERINEAL POST (MISCELLANEOUS) ×3 IMPLANT
COVER WAND RF STERILE (DRAPES) IMPLANT
DRAPE STERI IOBAN 125X83 (DRAPES) ×3 IMPLANT
DRSG ADAPTIC 3X8 NADH LF (GAUZE/BANDAGES/DRESSINGS) ×3 IMPLANT
DRSG AQUACEL AG ADV 3.5X 4 (GAUZE/BANDAGES/DRESSINGS) ×3 IMPLANT
DRSG MEPILEX BORDER 4X4 (GAUZE/BANDAGES/DRESSINGS) ×3 IMPLANT
DURAPREP 26ML APPLICATOR (WOUND CARE) ×6 IMPLANT
ELECT CAUTERY BLADE 6.4 (BLADE) ×3 IMPLANT
ELECT REM PT RETURN 9FT ADLT (ELECTROSURGICAL) ×3
ELECTRODE REM PT RTRN 9FT ADLT (ELECTROSURGICAL) ×1 IMPLANT
GLOVE BIOGEL PI IND STRL 7.0 (GLOVE) ×4 IMPLANT
GLOVE BIOGEL PI INDICATOR 7.0 (GLOVE) ×8
GLOVE ECLIPSE 7.0 STRL STRAW (GLOVE) ×9 IMPLANT
GLOVE SKINSENSE NS SZ7.5 (GLOVE) ×6
GLOVE SKINSENSE STRL SZ7.5 (GLOVE) ×3 IMPLANT
GOWN STRL REIN XL XLG (GOWN DISPOSABLE) ×9 IMPLANT
GOWN STRL REUS W/ TWL LRG LVL3 (GOWN DISPOSABLE) ×2 IMPLANT
GOWN STRL REUS W/TWL LRG LVL3 (GOWN DISPOSABLE) ×4
KIT BASIN OR (CUSTOM PROCEDURE TRAY) ×3 IMPLANT
KIT TURNOVER KIT B (KITS) ×3 IMPLANT
MANIFOLD NEPTUNE II (INSTRUMENTS) IMPLANT
NS IRRIG 1000ML POUR BTL (IV SOLUTION) ×3 IMPLANT
PACK GENERAL/GYN (CUSTOM PROCEDURE TRAY) ×3 IMPLANT
PAD ARMBOARD 7.5X6 YLW CONV (MISCELLANEOUS) ×6 IMPLANT
PIN GUIDE 3.2X300MM (PIN) ×9 IMPLANT
SCREW BONE CANN 7X90 HEX (Screw) ×3 IMPLANT
SCREW CANN 7.0X100 (Screw) ×3 IMPLANT
SCREW CANN HEX 7X80 (Screw) ×3 IMPLANT
STAPLER VISISTAT 35W (STAPLE) IMPLANT
SUT ETHILON 3 0 PS 1 (SUTURE) ×3 IMPLANT
SUT VIC AB 0 CT1 27 (SUTURE) ×3
SUT VIC AB 0 CT1 27XBRD ANBCTR (SUTURE) ×1 IMPLANT
SUT VIC AB 2-0 CT1 27 (SUTURE) ×2
SUT VIC AB 2-0 CT1 TAPERPNT 27 (SUTURE) ×1 IMPLANT
TOWEL GREEN STERILE (TOWEL DISPOSABLE) ×3 IMPLANT
TOWEL GREEN STERILE FF (TOWEL DISPOSABLE) ×3 IMPLANT
WATER STERILE IRR 1000ML POUR (IV SOLUTION) ×3 IMPLANT

## 2020-04-14 NOTE — Plan of Care (Signed)
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
## 2020-04-14 NOTE — Transfer of Care (Signed)
Immediate Anesthesia Transfer of Care Note  Patient: Kendra [name]  Procedure(s) Performed: CANNULATED HIP PINNING (Right Hip)  Patient Location: PACU  Anesthesia Type:General  Level of Consciousness: alert  and oriented  Airway & Oxygen Therapy: Patient Spontanous Breathing and Patient connected to nasal cannula oxygen  Post-op Assessment: Report given to RN and Post -op Vital signs reviewed and stable  Post vital signs: Reviewed and stable  Last Vitals:  Vitals Value Taken Time  BP 73/48 04/14/20 1434  Temp    Pulse 67 04/14/20 1436  Resp 16 04/14/20 1436  SpO2 92 % 04/14/20 1436  Vitals shown include unvalidated device data.  Last Pain:  Vitals:   04/14/20 0805  TempSrc: Oral  PainSc:       Patients Stated Pain Goal: 2 (04/14/20 0115)  Complications: No complications documented.

## 2020-04-14 NOTE — Op Note (Signed)
   Date of Surgery: 04/14/2020  INDICATIONS: Kendra [name] is a 69 y.o.-year-old female who sustained a hip fracture; she was indicated for open reduction and internal fixation due to the displaced nature of the fracture and came to the operating room today for this procedure. The patient did consent to the procedure after discussion of the risks and benefits.   PREOPERATIVE DIAGNOSIS: right subcapital femoral neck fracture  POSTOPERATIVE DIAGNOSIS: Same.  PROCEDURE: Open treatment of proximal end of femur, neck with internal fixation. CPT [phone].  SURGEON: N. Glee Arvin, M.D.  ASSIST: Starlyn Skeans [place], [place]; necessary for the timely completion of procedure and due to complexity of procedure.  ANESTHESIA: general  IV FLUIDS AND URINE: See anesthesia.  ESTIMATED BLOOD LOSS: minimal mL.  IMPLANTS: [employer]  DRAINS: None.  COMPLICATIONS: see description of procedure.  DESCRIPTION OF PROCEDURE: The patient was brought to the operating room and placed supine on the operating table.  The patient had been signed prior to the procedure and this was documented. The patient had the anesthesia placed by the anesthesiologist.  The prep verification and incision time-outs were performed to confirm that this was the correct patient, site, side and location. The patient had SCDs in place. The patient did receive antibiotics prior to the incision and was redosed during the procedure as needed at indicated intervals.  The patient's well leg was placed in a flexed lithotomy position and carefully padded.  The operative leg was placed in traction and also well-padded.  Care was taken to confirm radiographs before prepping and draping. The hip was prepped and draped in the standard fashion.  Screws were placed using the following technique.  The 0.062" Kirschner wire was first placed and confirmed to be in the proper location on both AP and lateral views. After the guidewire was placed, the skin  incision was made over the guidewire, then the 4.5 mm cannulated drill was started over the wire.  The drill was used to drill the bony corridor, again confirming during drilling on both views. The final cannulated screw guidewire was then placed and again confirmed in position on both views.  The measuring stick was used to measure the length of screws.  This was repeated for all screws.  The approach-withdraw phenomenon was visualized under live fluoroscopy to confirm that all screws were of the appropriate length and in the head.   All wounds were copiously irrigated with saline and then the skin was reapproximated with staples. The wounds were cleaned and dried a final time and a sterile dressing. The patient was then transferred back to the bed and left the operating room in stable condition.  All sponge and instrument counts were correct.  POSTOPERATIVE PLAN: 25% partial weight bearing.  Mobilize with PT.  Follow up in the office in 2 weeks for suture removal.  Begin lovenox 12 hours postop.

## 2020-04-14 NOTE — Anesthesia Procedure Notes (Signed)
Procedure Name: MAC Date/Time: 04/14/2020 1:30 PM Performed by: Kathryne Hitch, CRNA Pre-anesthesia Checklist: Patient identified, Emergency Drugs available, Suction available and Patient being monitored Patient Re-evaluated:Patient Re-evaluated prior to induction Oxygen Delivery Method: Simple face mask Preoxygenation: Pre-oxygenation with 100% oxygen Induction Type: IV induction Dental Injury: Teeth and Oropharynx as per pre-operative assessment

## 2020-04-14 NOTE — Anesthesia Preprocedure Evaluation (Addendum)
Anesthesia Evaluation  Patient identified by MRN, date of birth, ID band Patient awake    Reviewed: Allergy & Precautions, NPO status , Patient's Chart, lab work & pertinent test results  History of Anesthesia Complications (+) PONV and history of anesthetic complications  Airway Mallampati: III  TM Distance: >3 FB Neck ROM: Full    Dental  (+) Dental Advisory Given   Pulmonary sleep apnea , neg recent URI,    breath sounds clear to auscultation       Cardiovascular hypertension, Pt. on medications (-) angina(-) Past MI and (-) CHF  Rhythm:Regular     Neuro/Psych  Neuromuscular disease negative psych ROS   GI/Hepatic Neg liver ROS, GERD  Medicated and Controlled,  Endo/Other  Morbid obesity  Renal/GU negative Renal ROS     Musculoskeletal  (+) Arthritis , Fibromyalgia -  Abdominal   Peds  Hematology negative hematology ROS (+) Denies blood thinners   Anesthesia Other Findings   Reproductive/Obstetrics                            Anesthesia Physical Anesthesia Plan  ASA: III  Anesthesia Plan: MAC and Spinal   Post-op Pain Management:    Induction:   PONV Risk Score and Plan: 3 and Treatment may vary due to age or medical condition and Propofol infusion  Airway Management Planned: Nasal Cannula  Additional Equipment:   Intra-op Plan:   Post-operative Plan:   Informed Consent: I have reviewed the patients History and Physical, chart, labs and discussed the procedure including the risks, benefits and alternatives for the proposed anesthesia with the patient or authorized representative who has indicated his/her understanding and acceptance.     Dental advisory given  Plan Discussed with: CRNA and Surgeon  Anesthesia Plan Comments:         Anesthesia Quick Evaluation
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
## 2020-04-15 ENCOUNTER — Encounter (HOSPITAL_COMMUNITY): Payer: Self-pay | Admitting: Family Medicine

## 2020-04-15 LAB — BASIC METABOLIC PANEL
Anion gap: 9 (ref 5–15)
BUN: 10 mg/dL (ref 8–23)
CO2: 27 mmol/L (ref 22–32)
Calcium: 8.6 mg/dL — ABNORMAL LOW (ref 8.9–10.3)
Chloride: 104 mmol/L (ref 98–111)
Creatinine, Ser: 0.73 mg/dL (ref 0.44–1.00)
GFR calc Af Amer: >60 mL/min (ref >60)
GFR calc non Af Amer: >60 mL/min (ref >60)
Glucose, Bld: 144 mg/dL — ABNORMAL HIGH (ref 70–99)
Potassium: 4.3 mmol/L (ref 3.5–5.1)
Sodium: 140 mmol/L (ref 135–145)

## 2020-04-15 LAB — CBC
HCT: 35.4 % — ABNORMAL LOW (ref 36.0–46.0)
Hemoglobin: 10.9 g/dL — ABNORMAL LOW (ref 12.0–15.0)
MCH: 29.2 pg (ref 26.0–34.0)
MCHC: 30.8 g/dL (ref 30.0–36.0)
MCV: 94.9 fL (ref 80.0–100.0)
Platelets: 212 10*3/uL (ref 150–400)
RBC: 3.73 MIL/uL — ABNORMAL LOW (ref 3.87–5.11)
RDW: 12.9 % (ref 11.5–15.5)
WBC: 11 10*3/uL — ABNORMAL HIGH (ref 4.0–10.5)
nRBC: 0 % (ref 0.0–0.2)

## 2020-04-15 NOTE — Progress Notes (Addendum)
Subjective: 1 Day Post-Op Procedure(s) (LRB): CANNULATED HIP PINNING (Right) Patient reports pain as mild.  Doing well this am   Objective: Vital signs in last 24 hours: Temp:  [97.6 F (36.4 C)-98.3 F (36.8 C)] 97.6 F (36.4 C) (08/09 0316) Pulse Rate:  [66-98] 66 (08/09 0316) Resp:  [11-16] 16 (08/09 0316) BP: (81-133)/(50-71) 112/50 (08/09 0316) SpO2:  [95 %-100 %] 96 % (08/09 0316)  Intake/Output from previous day: 08/08 0701 - 08/09 0700 In: 1941.6 [P.O.:240; I.V.:1548.6; IV Piggyback:153] Out: 550 [Urine:500; Blood:50] Intake/Output this shift: No intake/output data recorded.  Recent Labs    04/14/20 0246 04/15/20 0302  HGB 12.2 10.9*   Recent Labs    04/14/20 0246 04/15/20 0302  WBC 14.3* 11.0*  RBC 4.13 3.73*  HCT 38.6 35.4*  PLT 238 212   Recent Labs    04/14/20 0246 04/15/20 0302  NA 141 140  K 3.8 4.3  CL 102 104  CO2 27 27  BUN 12 10  CREATININE 0.79 0.73  GLUCOSE 135* 144*  CALCIUM 9.3 8.6*   No results for input(s): LABPT, INR in the last 72 hours.  Neurologically intact Neurovascular intact Sensation intact distally Intact pulses distally Dorsiflexion/Plantar flexion intact Incision: dressing C/D/I No cellulitis present Compartment soft   Assessment/Plan: 1 Day Post-Op Procedure(s) (LRB): CANNULATED HIP PINNING (Right) Up with therapy  RLE-25% weight bearing. Continue to ice and elevate RUE- platform weight bearing.  Sling.  Ice for pain and swelling Lovenox/scd's for pain and swelling F/u with Dr. Roda Shutters 2 weeks post-op      Cristie Hem 04/15/2020, 9:35 AM

## 2020-04-15 NOTE — Progress Notes (Signed)
Orthopedic Tech Progress Note Patient Details:  Kendra [name] 08-23-1951 [id number]  Ortho Devices Type of Ortho Device: Shoulder immobilizer Ortho Device/Splint Location: RUE Ortho Device/Splint Interventions: Ordered, Application   Post Interventions Patient Tolerated: Well Instructions Provided: Adjustment of device   Kerry Fort 04/15/2020, 9:48 AM

## 2020-04-15 NOTE — Evaluation (Signed)
Occupational Therapy Evaluation Patient Details Name: Kendra [name] MRN: [id number] DOB: 1950/12/18 Today's Date: 04/15/2020    History of Present Illness Pt is a 69 year old woman admitted from [facility] for management of R subcapital hip fx and R minimally displaced greater tuberosity shoulder fx. Underwent ORIF of R hip, R shoulder to be managed conservatively. PMH: HTN, fibromyalgia, arthritis, R TKA, sleep apnea, morbid obesity.    Clinical Impression   Pt was independent prior to admission. Presents with R shoulder pain and impaired balance. Pt was able to squat-pivot to chair with min assist adhering to 25% WB precaution. She requires set up to total assist for ADL. Pt lives alone and will have support of her sons and friends intermittently. Sons plan to have a ramp built. Began educating pt in compensatory strategies for ADL adhering to restrictions. Recommending ST rehab in SNF prior to returning home, pt is in agreement. Will follow acutely.    Follow Up Recommendations  SNF;Supervision/Assistance - 24 hour    Equipment Recommendations       Recommendations for Other Services       Precautions / Restrictions Precautions Precautions: Fall Required Braces or Orthoses: Sling Restrictions Weight Bearing Restrictions: Yes RUE Weight Bearing:  (may weight bear with platform walker, per secure chat with X) RLE Weight Bearing: Partial weight bearing RLE Partial Weight Bearing Percentage or Pounds: 25%      Mobility Bed Mobility Overal bed mobility: Needs Assistance Bed Mobility: Supine to Sit     Supine to sit: Supervision     General bed mobility comments: HOB up  Transfers Overall transfer level: Needs assistance   Transfers: Squat Pivot Transfers     Squat pivot transfers: Min assist     General transfer comment: cues to maintain WB status    Balance Overall balance assessment: Needs assistance   Sitting balance-Leahy Scale: Good     Standing  balance support: Single extremity supported Standing balance-Leahy Scale: Poor                             ADL either performed or assessed with clinical judgement   ADL Overall ADL's : Needs assistance/impaired Eating/Feeding: Independent   Grooming: Set up;Sitting   Upper Body Bathing: Moderate assistance;Sitting   Lower Body Bathing: Bed level;Maximal assistance   Upper Body Dressing : Moderate assistance;Sitting   Lower Body Dressing: Total assistance;Bed level   Toilet Transfer: Minimal assistance;Stand-pivot   Toileting- Clothing Manipulation and Hygiene: Set up;Sitting/lateral lean               Vision Patient Visual Report: No change from baseline       Perception     Praxis      Pertinent Vitals/Pain Pain Assessment: Faces Faces Pain Scale: Hurts little more Pain Location: R shoulder Pain Descriptors / Indicators: Sore;Guarding Pain Intervention(s): Monitored during session;Repositioned     Hand Dominance Right   Extremity/Trunk Assessment Upper Extremity Assessment Upper Extremity Assessment: RUE deficits/detail RUE Deficits / Details: WFL elbow to hand, shoulder not assessed, sling ordered RUE Coordination: decreased gross motor   Lower Extremity Assessment Lower Extremity Assessment: Defer to PT evaluation   Cervical / Trunk Assessment Cervical / Trunk Assessment: Other exceptions Cervical / Trunk Exceptions: hx of back pain   Communication Communication Communication: No difficulties   Cognition Arousal/Alertness: Awake/alert Behavior During Therapy: WFL for tasks assessed/performed Overall Cognitive Status: Within Functional Limits for tasks assessed  General Comments       Exercises     Shoulder Instructions      Home Living Family/patient expects to be discharged to:: Private residence Living Arrangements: Alone Available Help at Discharge: Family;Available  PRN/intermittently;Friend(s) Type of Home: House Home Access: Stairs to enter [employer] of Steps: 3 (small steps)   Home Layout: One level     Bathroom Shower/Tub: Walk-in shower         Home Equipment: Shower seat;Grab bars - tub/shower;Hand held Careers information officer - 2 wheels;Cane - single point;Crutches;Electric scooter   Additional Comments: 2 sons lives close by. Building a ramp      Prior Functioning/Environment Level of Independence: Independent        Comments: driving, works [place] days a week at the [employer]        OT Problem List: Impaired balance (sitting and/or standing);Decreased knowledge of use of DME or AE;Pain;Decreased knowledge of precautions;Obesity;Impaired UE functional use      OT Treatment/Interventions: Self-care/ADL training;DME and/or AE instruction;Therapeutic activities;Patient/family education;Balance training    OT Goals(Current goals can be found in the care plan section) Acute Rehab OT Goals Patient Stated Goal: return home once ramp is built OT Goal Formulation: With patient Time For Goal Achievement: 04/29/20 Potential to Achieve Goals: Good ADL Goals Pt Will Perform Upper Body Bathing: with min assist;sitting Pt Will Perform Lower Body Bathing: with min assist;sitting/lateral leans Pt Will Perform Upper Body Dressing: with min assist;sitting Pt Will Perform Lower Body Dressing: with min assist;sitting/lateral leans Pt Will Transfer to Toilet: with supervision;ambulating;bedside commode (over toilet) Pt Will Perform Toileting - Clothing Manipulation and hygiene: with min assist;sitting/lateral leans Additional ADL Goal #1: Pt will maintain PWB on R LE during ADL and ADL transfers.  OT Frequency: Min 2X/week   Barriers to D/C: Decreased caregiver support          Co-evaluation              AM-PAC OT "6 Clicks" Daily Activity     Outcome Measure Help from another person eating meals?: None Help from another person  taking care of personal grooming?: A Little Help from another person toileting, which includes using toliet, bedpan, or urinal?: A Little Help from another person bathing (including washing, rinsing, drying)?: A Lot Help from another person to put on and taking off regular upper body clothing?: A Lot Help from another person to put on and taking off regular lower body clothing?: A Lot 6 Click Score: 16   End of Session Nurse Communication: Mobility status  Activity Tolerance: Patient tolerated treatment well Patient left: in chair;with call bell/phone within reach;with chair alarm set  OT Visit Diagnosis: Pain;History of falling (Z91.81);Unsteadiness on feet (R26.81)                Time: 3016-0109 OT Time Calculation (min): 27 min Charges:  OT General Charges $OT Visit: 1 Visit OT Evaluation $OT Eval Moderate Complexity: 1 Mod  Martie Round, OTR/L Acute Rehabilitation Services Pager: [phone] Office: [phone]  Evern Bio 04/15/2020, 9:45 AM
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
## 2020-04-15 NOTE — Evaluation (Signed)
Physical Therapy Evaluation Patient Details Name: Kendra [name] MRN: [id number] DOB: 09-24-1950 Today's Date: 04/15/2020   History of Present Illness  Pt is a 69 year old woman admitted from [facility] for management of R subcapital hip fx and R minimally displaced greater tuberosity shoulder fx. Underwent ORIF of R hip, R shoulder to be managed conservatively. PMH: HTN, fibromyalgia, arthritis, R TKA, sleep apnea, morbid obesity.   Clinical Impression  Prior to admission, pt lives alone and is independent. Pt presents with decreased functional mobility secondary to weightbearing precautions, decreased functional use of RUE and pain, and balance deficits. Pt requiring min assist for low pivot transfer towards left. Cues for maintaining weightbearing precautions. Pt sons plan to build ramp and assist intermittently. Pt interested in ST rehab in SNF initially. Think this is appropriate considering pt deficits, decreased caregiver support, and inaccessible house. Will continue to follow acutely to initiate transfer/gait training with use of right platform walker.     Follow Up Recommendations SNF    Equipment Recommendations  Wheelchair (measurements PT);Wheelchair cushion (measurements PT);3in1 (PT);Other (comment) (R platform RW)    Recommendations for Other Services       Precautions / Restrictions Precautions Precautions: Fall Required Braces or Orthoses: Sling Restrictions Weight Bearing Restrictions: Yes RUE Weight Bearing:  (may weightbear for platform RW) RLE Weight Bearing: Partial weight bearing RLE Partial Weight Bearing Percentage or Pounds: 25%      Mobility  Bed Mobility Overal bed mobility: Needs Assistance Bed Mobility: Supine to Sit     Supine to sit: Supervision     General bed mobility comments: HOB up  Transfers Overall transfer level: Needs assistance   Transfers: Squat Pivot Transfers     Squat pivot transfers: Min assist     General  transfer comment: cues to maintain WB status  Ambulation/Gait                Stairs            Wheelchair Mobility    Modified Rankin (Stroke Patients Only)       Balance Overall balance assessment: Needs assistance   Sitting balance-Leahy Scale: Good     Standing balance support: Single extremity supported Standing balance-Leahy Scale: Poor                               Pertinent Vitals/Pain Pain Assessment: Faces Faces Pain Scale: Hurts little more Pain Location: R shoulder Pain Descriptors / Indicators: Sore;Guarding Pain Intervention(s): Monitored during session;Repositioned    Home Living Family/patient expects to be discharged to:: Private residence Living Arrangements: Alone Available Help at Discharge: Family;Available PRN/intermittently;Friend(s) Type of Home: House Home Access: Stairs to enter   [employer] of Steps: 3 (small steps) Home Layout: One level Home Equipment: Shower seat;Grab bars - tub/shower;Hand held shower head;Walker - 2 wheels;Cane - single point;Crutches;Electric scooter Additional Comments: 2 sons lives close by. Building a ramp    Prior Function Level of Independence: Independent         Comments: driving, works 3 days a week at the [employer]   Dominant Hand: Right    Extremity/Trunk Assessment   Upper Extremity Assessment Upper Extremity Assessment: RUE deficits/detail RUE Deficits / Details: WFL elbow to hand, shoulder not assessed, sling ordered RUE Coordination: decreased gross motor    Lower Extremity Assessment Lower Extremity Assessment: RLE deficits/detail RLE Deficits / Details: Femoral neck fx s/p  ORIF. At least 3/5 strength, able to perform heel slide, LAQ, SLR    Cervical / Trunk Assessment Cervical / Trunk Assessment: Other exceptions Cervical / Trunk Exceptions: hx of back pain  Communication   Communication: No difficulties  Cognition  Arousal/Alertness: Awake/alert Behavior During Therapy: WFL for tasks assessed/performed Overall Cognitive Status: Within Functional Limits for tasks assessed                                        General Comments      Exercises General Exercises - Lower Extremity Long Arc Quad: Right;10 reps;Seated   Assessment/Plan    PT Assessment Patient needs continued PT services  PT Problem List Decreased strength;Decreased range of motion;Decreased balance;Decreased mobility;Pain       PT Treatment Interventions DME instruction;Gait training;Functional mobility training;Therapeutic activities;Therapeutic exercise;Stair training;Balance training;Patient/family education;Manual techniques    PT Goals (Current goals can be found in the Care Plan section)  Acute Rehab PT Goals Patient Stated Goal: return home once ramp is built PT Goal Formulation: With patient Time For Goal Achievement: 04/29/20 Potential to Achieve Goals: Good    Frequency Min 3X/week   Barriers to discharge        Co-evaluation               AM-PAC PT "6 Clicks" Mobility  Outcome Measure Help needed turning from your back to your side while in a flat bed without using bedrails?: None Help needed moving from lying on your back to sitting on the side of a flat bed without using bedrails?: None Help needed moving to and from a bed to a chair (including a wheelchair)?: A Little Help needed standing up from a chair using your arms (e.g., wheelchair or bedside chair)?: A Little Help needed to walk in hospital room?: A Little Help needed climbing 3-5 steps with a railing? : A Lot 6 Click Score: 19    End of Session   Activity Tolerance: Patient tolerated treatment well Patient left: in chair;with call bell/phone within reach;with chair alarm set Nurse Communication: Mobility status PT Visit Diagnosis: Pain;Difficulty in walking, not elsewhere classified (R26.2) Pain - Right/Left: Right Pain  - part of body: Shoulder    Time: 3875-6433 PT Time Calculation (min) (ACUTE ONLY): 29 min   Charges:   PT Evaluation $PT Eval Moderate Complexity: 1 Mod            Lillia Pauls, PT, DPT Acute Rehabilitation Services Pager [phone] Office [phone]   Norval Morton 04/15/2020, 11:06 AM

## 2020-04-15 NOTE — Plan of Care (Signed)

## 2020-04-16 LAB — BASIC METABOLIC PANEL
Anion gap: 10 (ref 5–15)
BUN: 16 mg/dL (ref 8–23)
CO2: 26 mmol/L (ref 22–32)
Calcium: 8.7 mg/dL — ABNORMAL LOW (ref 8.9–10.3)
Chloride: 104 mmol/L (ref 98–111)
Creatinine, Ser: 0.77 mg/dL (ref 0.44–1.00)
GFR calc Af Amer: >60 mL/min (ref >60)
GFR calc non Af Amer: >60 mL/min (ref >60)
Glucose, Bld: 118 mg/dL — ABNORMAL HIGH (ref 70–99)
Potassium: 4.5 mmol/L (ref 3.5–5.1)
Sodium: 140 mmol/L (ref 135–145)

## 2020-04-16 LAB — CBC
HCT: 35 % — ABNORMAL LOW (ref 36.0–46.0)
Hemoglobin: 10.7 g/dL — ABNORMAL LOW (ref 12.0–15.0)
MCH: 29.8 pg (ref 26.0–34.0)
MCHC: 30.6 g/dL (ref 30.0–36.0)
MCV: 97.5 fL (ref 80.0–100.0)
Platelets: 199 10*3/uL (ref 150–400)
RBC: 3.59 MIL/uL — ABNORMAL LOW (ref 3.87–5.11)
RDW: 13 % (ref 11.5–15.5)
WBC: 11.8 10*3/uL — ABNORMAL HIGH (ref 4.0–10.5)
nRBC: 0 % (ref 0.0–0.2)

## 2020-04-16 NOTE — Progress Notes (Signed)
Subjective: 2 Days Post-Op Procedure(s) (LRB): CANNULATED HIP PINNING (Right) Patient reports pain as mild.    Objective: Vital signs in last 24 hours: Temp:  [98 F (36.7 C)-98.3 F (36.8 C)] 98.3 F (36.8 C) (08/10 0758) Pulse Rate:  [61-75] 69 (08/10 0758) Resp:  [14-17] 16 (08/10 0758) BP: (99-128)/(55-68) 128/59 (08/10 0758) SpO2:  [91 %-96 %] 92 % (08/10 0758)  Intake/Output from previous day: 08/09 0701 - 08/10 0700 In: 252.6 [I.V.:52.6; IV Piggyback:200] Out: -  Intake/Output this shift: No intake/output data recorded.  Recent Labs    04/14/20 0246 04/15/20 0302 04/16/20 0220  HGB 12.2 10.9* 10.7*   Recent Labs    04/15/20 0302 04/16/20 0220  WBC 11.0* 11.8*  RBC 3.73* 3.59*  HCT 35.4* 35.0*  PLT 212 199   Recent Labs    04/15/20 0302 04/16/20 0220  NA 140 140  K 4.3 4.5  CL 104 104  CO2 27 26  BUN 10 16  CREATININE 0.73 0.77  GLUCOSE 144* 118*  CALCIUM 8.6* 8.7*   No results for input(s): LABPT, INR in the last 72 hours.  Neurologically intact Neurovascular intact Sensation intact distally Intact pulses distally Dorsiflexion/Plantar flexion intact Incision: dressing C/D/I No cellulitis present Compartment soft RUE- sling in place.  Fingers are warm and well perfused.   Assessment/Plan: 2 Days Post-Op Procedure(s) (LRB): CANNULATED HIP PINNING (Right) Up with therapy RLE-25% weight bearing. Continue to ice and elevate. Apply ice as needed for pain and swelling RUE- platform weight bearing.  Sling.  Ice for pain and swelling Lovenox/scd's for pain and swelling F/u with Dr. Roda Shutters 2 weeks post-op      Cristie Hem 04/16/2020, 8:00 AM
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
## 2020-04-16 NOTE — NC FL2 (Addendum)
[place] MEDICAID FL2 LEVEL OF CARE SCREENING TOOL     IDENTIFICATION  Patient Name: Kendra [name] Birthdate: 28-Jun-1951 Sex: female Admission Date (Current Location): 04/14/2020  [facility] and [place] Number:  Producer, television/film/video and Address:  [facility] [place], [address] [place], [place] [address]      Provider Number: [id number]  Attending Physician Name and Address:  Cipriano Bunker, MD  Relative Name and Phone Number:       Current Level of Care: Hospital Recommended Level of Care: Skilled Nursing Facility Prior Approval Number:    Date Approved/Denied:   PASRR Number:  [id number]  Discharge Plan: SNF    Current Diagnoses: Patient Active Problem List   Diagnosis Date Noted   Closed right hip fracture (HCC) 04/14/2020   Unspecified fracture of upper end of right humerus, initial encounter for closed fracture 04/14/2020   Essential hypertension 04/14/2020   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    Orientation RESPIRATION BLADDER Height & Weight     Self, Time, Situation, Place  Normal Continent Weight: 108.9 kg Height:  5' 4.5" (163.8 cm)  BEHAVIORAL SYMPTOMS/MOOD NEUROLOGICAL BOWEL NUTRITION STATUS      Continent Diet (refer to d/c summary)  AMBULATORY STATUS COMMUNICATION OF NEEDS Skin   Extensive Assist Verbally Surgical wounds (S/P ORIF of R hip, 8/8)                       Personal Care Assistance Level of Assistance  Bathing, Feeding, Dressing Bathing Assistance: Limited assistance Feeding assistance: Independent Dressing Assistance: Limited assistance     Functional Limitations Info  Sight, Hearing, Speech Sight Info: Adequate Hearing Info: Adequate Speech Info: Adequate    SPECIAL CARE FACTORS FREQUENCY  PT (By licensed PT), OT (By licensed OT)     PT Frequency: 5x/ week, evaluate and treat OT Frequency: 5x/ week, evaluate and treat            Contractures Contractures Info: Not present     Additional Factors Info  Code Status, Allergies Code Status Info: Full Code Allergies Info: Ibuprofen, Nsaids, Other, Codeine, Hydrocodone-acetaminophen, Hydromorphone, Morphine And Related, Oxycodone, Propoxyphene, Tizanidine, Tramadol Hcl, Prednisone           Current Medications (04/16/2020):  This is the current hospital active medication list Current Facility-Administered Medications  Medication Dose Route Frequency Provider Last Rate Last Admin   0.9 %  sodium chloride infusion   Intravenous Continuous Tarry Kos, MD   Stopped at 04/15/20 [phone]   acetaminophen (TYLENOL) tablet 325-650 mg  325-650 mg Oral Q6H PRN Tarry Kos, MD       acetaminophen (TYLENOL) tablet 650 mg  650 mg Oral Q6H PRN Tarry Kos, MD   650 mg at 04/16/20 0642   alum & mag hydroxide-simeth (MAALOX/MYLANTA) 200-200-20 MG/5ML suspension 30 mL  30 mL Oral Q4H PRN Tarry Kos, MD       Chlorhexidine Gluconate Cloth 2 % PADS 6 each  6 each Topical Q0600 Tarry Kos, MD   6 each at 04/16/20 8242   docusate sodium (COLACE) capsule 100 mg  100 mg Oral BID Tarry Kos, MD   100 mg at 04/16/20 0920   enoxaparin (LOVENOX) injection 40 mg  40 mg Subcutaneous Q24H Marzetta Board, MD   40 mg at 04/16/20 0920   ferrous sulfate tablet 325 mg  325 mg Oral Q breakfast Tarry Kos, MD   325 mg  at 04/16/20 0920   gabapentin (NEURONTIN) capsule 400 mg  400 mg Oral TID Tarry Kos, MD   400 mg at 04/16/20 0920   HYDROmorphone (DILAUDID) injection 0.5-1 mg  0.5-1 mg Intravenous Q4H PRN Tarry Kos, MD       losartan (COZAAR) tablet 100 mg  100 mg Oral Daily Tarry Kos, MD   100 mg at 04/16/20 0920   magnesium citrate solution 1 Bottle  1 Bottle Oral Once PRN Tarry Kos, MD       menthol-cetylpyridinium (CEPACOL) lozenge 3 mg  1 lozenge Oral PRN Tarry Kos, MD       Or   phenol (CHLORASEPTIC) mouth spray 1 spray  1 spray Mouth/Throat PRN Tarry Kos, MD       methocarbamol  (ROBAXIN) tablet 500 mg  500 mg Oral Q6H PRN Tarry Kos, MD       Or   methocarbamol (ROBAXIN) 500 mg in dextrose 5 % 50 mL IVPB  500 mg Intravenous Q6H PRN Tarry Kos, MD       morphine 2 MG/ML injection 2 mg  2 mg Intravenous Q3H PRN Tarry Kos, MD       mupirocin ointment (BACTROBAN) 2 % 1 application  1 application Nasal BID Tarry Kos, MD   1 application at 04/16/20 0921   ondansetron (ZOFRAN) tablet 4 mg  4 mg Oral Q6H PRN Tarry Kos, MD       Or   ondansetron [facility]) injection 4 mg  4 mg Intravenous Q6H PRN Tarry Kos, MD       oxyCODONE (Oxy IR/ROXICODONE) immediate release tablet 10-15 mg  10-15 mg Oral Q4H PRN Tarry Kos, MD       oxyCODONE (Oxy IR/ROXICODONE) immediate release tablet 5-10 mg  5-10 mg Oral Q4H PRN Tarry Kos, MD   5 mg at 04/15/20 2236   oxyCODONE (OXYCONTIN) 12 hr tablet 10 mg  10 mg Oral Q12H Tarry Kos, MD   10 mg at 04/16/20 3474   pantoprazole (PROTONIX) EC tablet 40 mg  40 mg Oral BID WC Tarry Kos, MD   40 mg at 04/16/20 0920   polyethylene glycol (MIRALAX / GLYCOLAX) packet 17 g  17 g Oral Daily PRN Tarry Kos, MD       senna-docusate (Senokot-S) tablet 1 tablet  1 tablet Oral QHS PRN Tarry Kos, MD       sorbitol 70 % solution 30 mL  30 mL Oral Daily PRN Tarry Kos, MD         Discharge Medications: Please see discharge summary for a list of discharge medications.  Relevant Imaging Results:  Relevant Lab Results:   Additional Information SS# [id number]  Epifanio Lesches, RN

## 2020-04-16 NOTE — Plan of Care (Signed)

## 2020-04-16 NOTE — TOC Initial Note (Addendum)
Transition of Care [facility]) - Initial/Assessment Note    Patient Details  Name: Kendra [name] MRN: [id number] Date of Birth: 02-03-1951  [facility]) CM/SW Contact:    Epifanio Lesches, RN Phone Number: 04/16/2020, 11:26 AM  Clinical Narrative:                 RNCM received consult for possible SNF placement at time of discharge. RNCM spoke with patient regarding PT recommendation of SNF placement at time of discharge. Patient states she is currently unable to care for self independently @  home given  current physical needs and fall risk. Patient expressed understanding of PT recommendation and is agreeable to SNF placement at time of discharge. Patient reports preference for [facility], [place]. RNCM discussed insurance authorization process and provided Medicare SNF ratings list. Patient expressed being hopeful for rehab and to feel better soon. No further questions reported at this time. RNCM to continue to follow and assist with discharge planning needs.  8/10 Reference # [id number] for SNF authorization with [employer].... authorization is still  Pending.  8/10 Auth ID for billing, [id number]. [employer] reference # [id number], approved x 3 days, start date 8/10, end dated 8/12. Burnett Kanaris CM.   Expected Discharge Plan: Skilled Nursing Facility ([facility], [place]) Barriers to Discharge: Insurance Authorization   Patient Goals and CMS Choice        Expected Discharge Plan and Services Expected Discharge Plan: Skilled Nursing Facility ([facility], [place] [place])                                              Prior Living Arrangements/Services                       Activities of Daily Living Home Assistive Devices/Equipment: None ADL Screening (condition at time of admission) Patient's cognitive ability adequate to safely complete daily activities?: Yes Is the patient deaf or have difficulty hearing?: No Does the patient have difficulty seeing, even when  wearing glasses/contacts?: No Does the patient have difficulty concentrating, remembering, or making decisions?: No Patient able to express need for assistance with ADLs?: Yes Does the patient have difficulty dressing or bathing?: No Independently performs ADLs?: Yes (appropriate for developmental age) Does the patient have difficulty walking or climbing stairs?: No Weakness of Legs: None Weakness of Arms/Hands: None  Permission Sought/Granted                  Emotional Assessment              Admission diagnosis:  Closed right hip fracture (HCC) [S72.001A] Patient Active Problem List   Diagnosis Date Noted  . Closed right hip fracture (HCC) 04/14/2020  . Unspecified fracture of upper end of right humerus, initial encounter for closed fracture 04/14/2020  . Essential hypertension 04/14/2020  . 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 [place] [address] Phone: [phone] Fax: [phone]     Social Determinants of Health (SDOH) Interventions    Readmission Risk Interventions No flowsheet data found.

## 2020-04-16 NOTE — Progress Notes (Addendum)
Physical Therapy Treatment Patient Details Name: Kendra [name] MRN: [id number] DOB: 07-Jan-1951 Today's Date: 04/16/2020    History of Present Illness Pt is a 69 year old woman admitted from [facility] for management of R subcapital hip fx and R minimally displaced greater tuberosity shoulder fx. Underwent ORIF of R hip, R shoulder to be managed conservatively. PMH: HTN, fibromyalgia, arthritis, R TKA, sleep apnea, morbid obesity.     PT Comments    Pt making excellent progress towards her physical therapy goals. Session focused on instructing pt on use of right platform walker. Pt requiring min guard assist for functional mobility. Ambulating 50 feet and maintaining weightbearing precautions fairly well with min cueing. Rest of session focused on therapeutic exercises for strengthening/ROM. Pt still interested in SNF due to decreased caregiver support and this remains appropriate based on deficits in order to maximize functional independence.     Follow Up Recommendations  SNF     Equipment Recommendations  Wheelchair (measurements PT);Wheelchair cushion (measurements PT);3in1 (PT);Other (comment) (R platform RW)    Recommendations for Other Services       Precautions / Restrictions Precautions Precautions: Fall Required Braces or Orthoses: Sling Restrictions Weight Bearing Restrictions: Yes RUE Weight Bearing:  (may weightbear for platform RW) RLE Weight Bearing: Partial weight bearing RLE Partial Weight Bearing Percentage or Pounds: 25    Mobility  Bed Mobility Overal bed mobility: Modified Independent Bed Mobility: Supine to Sit           General bed mobility comments: HOB up  Transfers Overall transfer level: Needs assistance Equipment used: Right platform walker Transfers: Sit to/from Stand;Stand Pivot Transfers Sit to Stand: Min guard Stand pivot transfers: Min guard       General transfer comment: Min guard for safety for transition to standing  and pivoting over to [facility]  Ambulation/Gait Ambulation/Gait assistance: Min guard Gait Distance (Feet): 50 Feet Assistive device: Right platform walker Gait Pattern/deviations: Step-through pattern;Decreased stance time - right;Decreased weight shift to right Gait velocity: decreased   General Gait Details: Visual/verbal cues for sequencing, weightbearing precautions, segmental turning   Stairs             Wheelchair Mobility    Modified Rankin (Stroke Patients Only)       Balance Overall balance assessment: Needs assistance   Sitting balance-Leahy Scale: Good     Standing balance support: Single extremity supported Standing balance-Leahy Scale: Poor                              Cognition Arousal/Alertness: Awake/alert Behavior During Therapy: WFL for tasks assessed/performed Overall Cognitive Status: Within Functional Limits for tasks assessed                                        Exercises General Exercises - Upper Extremity Elbow Flexion: Right;10 reps;Seated Elbow Extension: Right;10 reps;Seated General Exercises - Lower Extremity Long Arc Quad: Right;10 reps;Seated Heel Slides: Both;10 reps;Supine Straight Leg Raises: Both;10 reps;Supine    General Comments        Pertinent Vitals/Pain Pain Assessment: Faces Faces Pain Scale: Hurts a little bit Pain Location: R shoulder, hip Pain Descriptors / Indicators: Sore;Guarding Pain Intervention(s): Monitored during session    Home Living  Prior Function            PT Goals (current goals can now be found in the care plan section) Acute Rehab PT Goals Patient Stated Goal: return home once ramp is built PT Goal Formulation: With patient Time For Goal Achievement: 04/29/20 Potential to Achieve Goals: Good Progress towards PT goals: Progressing toward goals    Frequency    Min 3X/week      PT Plan Current plan remains appropriate     Co-evaluation              AM-PAC PT "6 Clicks" Mobility   Outcome Measure  Help needed turning from your back to your side while in a flat bed without using bedrails?: None Help needed moving from lying on your back to sitting on the side of a flat bed without using bedrails?: None Help needed moving to and from a bed to a chair (including a wheelchair)?: A Little Help needed standing up from a chair using your arms (e.g., wheelchair or bedside chair)?: A Little Help needed to walk in hospital room?: A Little Help needed climbing 3-5 steps with a railing? : A Lot 6 Click Score: 19    End of Session Equipment Utilized During Treatment: Gait belt Activity Tolerance: Patient tolerated treatment well Patient left: in chair;with call bell/phone within reach;with chair alarm set Nurse Communication: Mobility status PT Visit Diagnosis: Pain;Difficulty in walking, not elsewhere classified (R26.2) Pain - Right/Left: Right Pain - part of body: Shoulder     Time: 3151-7616 PT Time Calculation (min) (ACUTE ONLY): 27 min  Charges:  $Gait Training: 8-22 mins $Therapeutic Exercise: 8-22 mins                       Lillia Pauls, PT, DPT Acute Rehabilitation Services Pager [phone] Office [phone]    Norval Morton 04/16/2020, 8:48 AM

## 2020-04-17 LAB — CBC
HCT: 35.4 % — ABNORMAL LOW (ref 36.0–46.0)
Hemoglobin: 10.7 g/dL — ABNORMAL LOW (ref 12.0–15.0)
MCH: 29.2 pg (ref 26.0–34.0)
MCHC: 30.2 g/dL (ref 30.0–36.0)
MCV: 96.5 fL (ref 80.0–100.0)
Platelets: 195 10*3/uL (ref 150–400)
RBC: 3.67 MIL/uL — ABNORMAL LOW (ref 3.87–5.11)
RDW: 13.2 % (ref 11.5–15.5)
WBC: 9.2 10*3/uL (ref 4.0–10.5)
nRBC: 0 % (ref 0.0–0.2)

## 2020-04-17 LAB — SARS CORONAVIRUS 2 (TAT 6-24 HRS): SARS Coronavirus 2: NEGATIVE

## 2020-04-17 NOTE — TOC Transition Note (Signed)
Transition of Care [facility]) - CM/SW Discharge Note   Patient Details  Name: SARAGRACE SELKE MRN: [id number] Date of Birth: Jan 29, 1951  Transition of Care [facility]) CM/SW Contact:  Epifanio Lesches, RN Phone Number: [phone] 04/17/2020, 2:51 PM   Clinical Narrative:     Patient will DC to: [facility], [place] Anticipated DC date: 04/17/2020 Family notified: pt states will notify family Transport JJ:HERD   Pt s/p  ORIF of R hip, R shoulder to be managed conservatively. PMH: HTN, fibromyalgia, arthritis, R TKA, sleep apnea, morbid obesity.   Per MD patient ready for DC today . RN, patient, patient's family, and facility notified of DC. Discharge Summary and FL2 sent to facility. RN to call report prior to discharge ([phone]).Rm# [id number] packet on chart. Ambulance transport requested for patient.   RNCM will sign off for now as intervention is no longer needed. Please consult [place] again if new needs arise.    Final next level of care: Skilled Nursing Facility Barriers to Discharge: No Barriers Identified ([facility], [place])   Patient Goals and CMS Choice        Discharge Placement                       Discharge Plan and Services                                     Social Determinants of Health (SDOH) Interventions     Readmission Risk Interventions No flowsheet data found.

## 2020-04-17 NOTE — Care Management Important Message (Signed)
Important Message  Patient Details  Name: Kendra [name] MRN: [id number] Date of Birth: 13-Apr-1951   Medicare Important Message Given:  Yes     Dorena Bodo 04/17/2020, 3:28 PM

## 2020-04-17 NOTE — Discharge Instructions (Signed)
    1. Change dressings as needed 2. May shower but keep incisions covered and dry 3. Take eliquis to prevent blood clots 4. Take stool softeners as needed 5. Take pain meds as needed   Advised to follow-up with primary care physician in 1 week. Advised to follow-up with orthopedics Dr.  Fayrene Fearing in 2 weeks for follow up. Advised to take Lovenox until follow-up with orthopedics.
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
## 2020-04-17 NOTE — Progress Notes (Signed)
Physical Therapy Treatment Patient Details Name: Kendra [name] MRN: [id number] DOB: Nov 25, 1950 Today's Date: 04/17/2020    History of Present Illness Pt is a 69 year old woman admitted from [facility] for management of R subcapital hip fx and R minimally displaced greater tuberosity shoulder fx. Underwent ORIF of R hip, R shoulder to be managed conservatively. PMH: HTN, fibromyalgia, arthritis, R TKA, sleep apnea, morbid obesity.     PT Comments    Pt progressing well towards her physical therapy goals. Reports excellent pain control in right hip; endorses soreness/aching in right shoulder. Session focused on gait training and therapeutic seated and standing exercises for BLE strengthening. Pt ambulating limited hallway distances at a min guard assist level. Pt continuing to request SNF at discharge; this remains appropriate based on deficits and decreased caregiver support.    Follow Up Recommendations  SNF     Equipment Recommendations  Wheelchair (measurements PT);Wheelchair cushion (measurements PT);3in1 (PT);Other (comment) (R platform RW)    Recommendations for Other Services       Precautions / Restrictions Precautions Precautions: Fall Required Braces or Orthoses: Sling Restrictions Weight Bearing Restrictions: Yes RUE Weight Bearing:  (may weightbear with R platform RW) RLE Weight Bearing: Partial weight bearing RLE Partial Weight Bearing Percentage or Pounds: 25    Mobility  Bed Mobility Overal bed mobility: Modified Independent Bed Mobility: Supine to Sit           General bed mobility comments: HOB up  Transfers Overall transfer level: Needs assistance Equipment used: Right platform walker Transfers: Sit to/from Stand Sit to Stand: Min guard            Ambulation/Gait Ambulation/Gait assistance: Min guard Gait Distance (Feet): 120 Feet Assistive device: Right platform walker Gait Pattern/deviations: Step-through pattern;Decreased stance  time - right;Decreased weight shift to right Gait velocity: decreased   General Gait Details: Verbal cues for weightbearing precautions, slowly momentum, activity pacing   Stairs             Wheelchair Mobility    Modified Rankin (Stroke Patients Only)       Balance Overall balance assessment: Needs assistance   Sitting balance-Leahy Scale: Good     Standing balance support: Single extremity supported Standing balance-Leahy Scale: Poor                              Cognition Arousal/Alertness: Awake/alert Behavior During Therapy: WFL for tasks assessed/performed Overall Cognitive Status: Within Functional Limits for tasks assessed                                        Exercises General Exercises - Lower Extremity Long Arc Quad: 10 reps;Seated;Both Hip Flexion/Marching: Both;10 reps;Seated Other Exercises Other Exercises: Standing with L hand support on sink: right hip flexion, abduction, extension x 10     General Comments        Pertinent Vitals/Pain Pain Assessment: Faces Faces Pain Scale: Hurts a little bit Pain Location: R shoulder, hip Pain Descriptors / Indicators: Sore;Guarding Pain Intervention(s): Monitored during session    Home Living                      Prior Function            PT Goals (current goals can now be found in the care plan section) Acute  Rehab PT Goals Patient Stated Goal: return home once ramp is built PT Goal Formulation: With patient Time For Goal Achievement: 04/29/20 Potential to Achieve Goals: Good Progress towards PT goals: Progressing toward goals    Frequency    Min 3X/week      PT Plan Current plan remains appropriate    Co-evaluation              AM-PAC PT "6 Clicks" Mobility   Outcome Measure  Help needed turning from your back to your side while in a flat bed without using bedrails?: None Help needed moving from lying on your back to sitting on the  side of a flat bed without using bedrails?: None Help needed moving to and from a bed to a chair (including a wheelchair)?: A Little Help needed standing up from a chair using your arms (e.g., wheelchair or bedside chair)?: A Little Help needed to walk in hospital room?: A Little Help needed climbing 3-5 steps with a railing? : A Lot 6 Click Score: 19    End of Session   Activity Tolerance: Patient tolerated treatment well Patient left: in chair;with call bell/phone within reach;with chair alarm set Nurse Communication: Mobility status PT Visit Diagnosis: Pain;Difficulty in walking, not elsewhere classified (R26.2) Pain - Right/Left: Right Pain - part of body: Shoulder     Time: 1610-9604 PT Time Calculation (min) (ACUTE ONLY): 23 min  Charges:  $Gait Training: 8-22 mins $Therapeutic Exercise: 8-22 mins                       Lillia Pauls, PT, DPT Acute Rehabilitation Services Pager [phone] Office [phone]    Norval Morton 04/17/2020, 10:54 AM

## 2020-04-17 NOTE — Plan of Care (Signed)
  Problem: Activity: Goal: Risk for activity intolerance will decrease Outcome: Progressing   Problem: Pain Managment: Goal: General experience of comfort will improve Outcome: Progressing   Problem: Safety: Goal: Ability to remain free from injury will improve Outcome: Progressing   

## 2020-04-17 NOTE — Progress Notes (Signed)
Report given to Bynum at [facility]. All questions answered. Pt belongings gathered to be sent with her. Pt waiting on [facility] for transport.

## 2020-04-19 ENCOUNTER — Telehealth: Payer: Self-pay | Admitting: Orthopaedic Surgery

## 2020-04-19 NOTE — Telephone Encounter (Signed)
Patient called.   She is in the care of [facility] and said they have no records on her. She is requesting a call back from her providers to establish any and all restrictions she may have.   Call back: [phone]

## 2020-04-20 ENCOUNTER — Encounter (HOSPITAL_COMMUNITY): Payer: Self-pay | Admitting: Orthopaedic Surgery

## 2020-04-20 MED ORDER — BUPIVACAINE IN DEXTROSE 0.75-8.25 % IT SOLN
INTRATHECAL | Status: DC | PRN
  Administered 2020-04-14: 1.4 mL via INTRATHECAL

## 2020-04-20 NOTE — Anesthesia Procedure Notes (Signed)
Spinal  Patient location during procedure: OR Start time: 04/14/2020 1:30 PM End time: 04/14/2020 1:34 PM Preanesthetic Checklist Completed: patient identified, IV checked, site marked, risks and benefits discussed, surgical consent, monitors and equipment checked, pre-op evaluation and timeout performed Spinal Block Patient position: left lateral decubitus Prep: DuraPrep Patient monitoring: heart rate, cardiac monitor, continuous pulse ox and blood pressure Approach: midline Location: L3-4 Injection technique: single-shot Needle Needle type: Sprotte  Needle gauge: 24 G Needle length: 9 cm Assessment Sensory level: T6

## 2020-04-20 NOTE — Anesthesia Postprocedure Evaluation (Addendum)
Anesthesia Post Note  Patient: Kendra [name]  Procedure(s) Performed: CANNULATED HIP PINNING (Right Hip)     Patient location during evaluation: PACU Anesthesia Type: MAC and Spinal Level of consciousness: awake and patient cooperative Pain management: pain level controlled Vital Signs Assessment: post-procedure vital signs reviewed and stable Respiratory status: spontaneous breathing, nonlabored ventilation, respiratory function stable and patient connected to nasal cannula oxygen Cardiovascular status: stable and blood pressure returned to baseline Postop Assessment: no apparent nausea or vomiting and spinal receding Anesthetic complications: no   No complications documented.  Vitals reviewed and stable              Zuleica Seith

## 2020-04-22 NOTE — Telephone Encounter (Signed)
I called patient and advised. She is also in sling for proximal humerus fracture and states that PT is asking her what they should do about that? Can she extend elbow?  Any ROM restrictions?  Stay in sling until follow up appt? Please advise.

## 2020-04-22 NOTE — Telephone Encounter (Signed)
25% PWB operative extremity.  No ROM restrictions

## 2020-04-22 NOTE — Telephone Encounter (Signed)
She can do any elbow and wrist ROM as tolerated.  Platform weight bearing to that arm.  No ROM of shoulder yet.  Sling at all times except when doing elbow and wrist stuff

## 2020-04-22 NOTE — Telephone Encounter (Signed)
Patient had SU 04/14/2020. Scheduled appt 04/30/2020. See message below.

## 2020-04-23 NOTE — Telephone Encounter (Signed)
FYI  I called patient and advised. She had questions about healing and when she would be able to leave the rehab facility.  She would like to have HHPT ordered when she gets home. She also wanted to know when she would be able to drive. I explained all of this would be discussed at first post op visit after x-rays are done and she is evaluated. She expressed understanding.

## 2020-04-30 ENCOUNTER — Encounter: Payer: Self-pay | Admitting: Orthopaedic Surgery

## 2020-04-30 ENCOUNTER — Ambulatory Visit (INDEPENDENT_AMBULATORY_CARE_PROVIDER_SITE_OTHER): Payer: Medicare PPO

## 2020-04-30 ENCOUNTER — Ambulatory Visit (INDEPENDENT_AMBULATORY_CARE_PROVIDER_SITE_OTHER): Payer: Medicare PPO | Admitting: Orthopaedic Surgery

## 2020-04-30 DIAGNOSIS — M25511 Pain in right shoulder: Secondary | ICD-10-CM

## 2020-04-30 DIAGNOSIS — M25551 Pain in right hip: Secondary | ICD-10-CM | POA: Diagnosis not present

## 2020-04-30 NOTE — Progress Notes (Signed)
   Post-Op Visit Note   Patient: Kendra [name]           Date of Birth: 04-29-1951           MRN: [id number] Visit Date: 04/30/2020 PCP: Stoney Bang, PA-C   Assessment & Plan:  Chief Complaint:  Chief Complaint  Patient presents with  . Right Hip - Pain  . Right Shoulder - Pain, Follow-up   Visit Diagnoses:  1. Right hip pain   2. Acute pain of right shoulder     Plan: Kendra [name] is 2 weeks status post right hip pinning and right greater tuberosity fracture.  Overall doing very well.  Reports no pain.  Currently at [facility].  She is progressing with PT and OT well.  Surgical incision is healed.  No signs of infection.  We will progress her to weight-bear as tolerated right lower extremity and begin pendulum range of motion to the right shoulder.  Recheck in 4 weeks with two-view x-rays of the right shoulder and right hip.  Follow-Up Instructions: Return in about 4 weeks (around 05/28/2020).   Orders:  Orders Placed This Encounter  Procedures  . XR HIP UNILAT W OR W/O PELVIS 2-3 VIEWS RIGHT  . XR Shoulder Right   No orders of the defined types were placed in this encounter.   Imaging: XR HIP UNILAT W OR W/O PELVIS 2-3 VIEWS RIGHT  Result Date: 04/30/2020 Stable cannulated screw fixation without complications.  XR Shoulder Right  Result Date: 04/30/2020 Increased bony consolidation of the greater tuberosity fracture.   PMFS History: Patient Active Problem List   Diagnosis Date Noted  . Essential hypertension 04/14/2020  . 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 Arthritis   . Complication of anesthesia   . Fibromyalgia   . GERD (gastroesophageal reflux disease)   . Hypertension   . PONV (postoperative nausea and vomiting)   . Sleep apnea    uses mouth guard    History reviewed. No pertinent family history.  Past Surgical History:  Procedure Laterality Date  . ABDOMINAL HYSTERECTOMY  2001  . CARTILAGE  SURGERY    . CESAREAN SECTION  1967  . CHOLECYSTECTOMY  2005  . HIP PINNING,CANNULATED Right 04/14/2020   Procedure: CANNULATED HIP PINNING;  Surgeon: Tarry Kos, MD;  Location: [facility] OR;  Service: Orthopedics;  Laterality: Right;  . MENISCUS REPAIR    . 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 TUBAL LIGATION  1984  . WRIST SURGERY Right    Torn Ligament   Social History   Occupational History  . Occupation: retired  Tobacco Use  . Smoking status: Never Smoker  . Smokeless tobacco: Never Used  Vaping Use  . Vaping Use: Never used  Substance and Sexual Activity  . Alcohol use: Never  . Drug use: Never  . Sexual activity: Not Currently

## 2020-05-08 ENCOUNTER — Telehealth: Payer: Self-pay | Admitting: Orthopaedic Surgery

## 2020-05-08 NOTE — Telephone Encounter (Signed)
Alfredo Bach from [facility] called.   He needs the weight baring status for the patient's rt shoulder and rt hip weight   Call back: [phone]

## 2020-05-08 NOTE — Telephone Encounter (Signed)
WBAT for both

## 2020-05-09 NOTE — Telephone Encounter (Signed)
Chelsea advised PT

## 2020-05-28 ENCOUNTER — Ambulatory Visit (INDEPENDENT_AMBULATORY_CARE_PROVIDER_SITE_OTHER): Payer: Medicare PPO | Admitting: Orthopaedic Surgery

## 2020-05-28 ENCOUNTER — Ambulatory Visit: Payer: Self-pay

## 2020-05-28 DIAGNOSIS — M25511 Pain in right shoulder: Secondary | ICD-10-CM | POA: Diagnosis not present

## 2020-05-28 DIAGNOSIS — M25551 Pain in right hip: Secondary | ICD-10-CM | POA: Diagnosis not present

## 2020-05-28 NOTE — Progress Notes (Signed)
Office Visit Note   Patient: Kendra [name]           Date of Birth: 11-13-1950           MRN: [id number] Visit Date: 05/28/2020              Requested by: Stoney Bang, PA-C [address] [place],  [place] [address] PCP: Stoney Bang, PA-C   Assessment & Plan: Visit Diagnoses:  1. Right hip pain   2. Acute pain of right shoulder     Plan: Impression is 6 weeks status post right hip pinning and right shoulder greater tuberosity fracture both with evidence of healing.  In regards to the hip, she will continue to increase activity as tolerated.  In regards to the shoulder, we will start her in outpatient physical therapy and external referral has been made.  She will follow up with [place] in 6 weeks time for repeat evaluation and 2 view x-rays of the right hip and 3 view x-rays of the right shoulder to include Grashey view.  Call with concerns or questions in the meantime.  Follow-Up Instructions: Return in about 6 weeks (around 07/09/2020).   Orders:  Orders Placed This Encounter  Procedures  . XR Shoulder Right  . XR HIP UNILAT W OR W/O PELVIS 2-3 VIEWS RIGHT  . Ambulatory referral to Physical Therapy   No orders of the defined types were placed in this encounter.     Procedures: No procedures performed   Clinical Data: No additional findings.   Subjective: Chief Complaint  Patient presents with  . Right Shoulder - Pain    HPI patient is a pleasant 69 year old female who comes in today 6 weeks out right hip pinning as well as for follow-up of a right shoulder greater tuberosity fracture 04/14/2020.  She has been doing very well.  In regards to the hip, she has no complaints of pain.  She has regained full motion.  In regards to the shoulder, she has slight achiness at night when trying to sleep.  She has been working on pendulum swings.  Review of Systems as detailed in HPI.  All others reviewed and are negative.   Objective: Vital Signs: There were no vitals  taken for this visit.  Physical Exam well-developed well-nourished female no acute distress.  Alert and oriented x3.  Ortho Exam right hip exam shows full range of motion and strength.  Right shoulder exam shows no tenderness the fracture site.  75% range of motion.  She is neurovascular intact distally.  Specialty Comments:  No specialty comments available.  Imaging: XR HIP UNILAT W OR W/O PELVIS 2-3 VIEWS RIGHT  Result Date: 05/28/2020 X-rays demonstrate stable alignment of the fracture without interval change  XR Shoulder Right  Result Date: 05/28/2020 X-rays demonstrate bony consolidation to the fracture site    PMFS History: Patient Active Problem List   Diagnosis Date Noted  . Essential hypertension 04/14/2020  . 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 Arthritis   . Complication of anesthesia   . Fibromyalgia   . GERD (gastroesophageal reflux disease)   . Hypertension   . PONV (postoperative nausea and vomiting)   . Sleep apnea    uses mouth guard    No family history on file.  Past Surgical History:  Procedure Laterality Date  . ABDOMINAL HYSTERECTOMY  2001  . CARTILAGE SURGERY    . CESAREAN SECTION  1967  .  CHOLECYSTECTOMY  2005  . HIP PINNING,CANNULATED Right 04/14/2020   Procedure: CANNULATED HIP PINNING;  Surgeon: Tarry Kos, MD;  Location: [facility] OR;  Service: Orthopedics;  Laterality: Right;  . MENISCUS REPAIR    . 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 TUBAL LIGATION  1984  . WRIST SURGERY Right    Torn Ligament   Social History   Occupational History  . Occupation: retired  Tobacco Use  . Smoking status: Never Smoker  . Smokeless tobacco: Never Used  Vaping Use  . Vaping Use: Never used  Substance and Sexual Activity  . Alcohol use: Never  . Drug use: Never  . Sexual  activity: Not Currently

## 2020-07-09 ENCOUNTER — Encounter: Payer: Self-pay | Admitting: Orthopaedic Surgery

## 2020-07-09 ENCOUNTER — Ambulatory Visit (INDEPENDENT_AMBULATORY_CARE_PROVIDER_SITE_OTHER): Payer: Medicare PPO

## 2020-07-09 ENCOUNTER — Ambulatory Visit (INDEPENDENT_AMBULATORY_CARE_PROVIDER_SITE_OTHER): Payer: Medicare PPO | Admitting: Orthopaedic Surgery

## 2020-07-09 VITALS — Ht 64.5 in | Wt 240.0 lb

## 2020-07-09 DIAGNOSIS — M25551 Pain in right hip: Secondary | ICD-10-CM

## 2020-07-09 DIAGNOSIS — M25511 Pain in right shoulder: Secondary | ICD-10-CM | POA: Diagnosis not present

## 2020-07-09 NOTE — Progress Notes (Signed)
Office Visit Note   Patient: Kendra [name]           Date of Birth: 11-19-1950           MRN: [id number] Visit Date: 07/09/2020              Requested by: Stoney Bang, PA-C [address] [place],  [place] [address] PCP: Stoney Bang, PA-C   Assessment & Plan: Visit Diagnoses:  1. Right hip pain   2. Acute pain of right shoulder     Plan: Impression is 12 weeks out right hip pinning and right shoulder greater tuberosity fracture.  Patient continues to do well.  She will advance with activity as tolerated.  She will follow up with [place] in 3 months time for repeat evaluation and 3 view x-rays of the right shoulder and AP pelvis x-rays of the right hip.  Call with concerns or questions in the meantime.  Follow-Up Instructions: Return in about 3 months (around 10/09/2020).   Orders:  Orders Placed This Encounter  Procedures  . XR HIP UNILAT W OR W/O PELVIS 2-3 VIEWS RIGHT  . XR Shoulder Right   No orders of the defined types were placed in this encounter.     Procedures: No procedures performed   Clinical Data: No additional findings.   Subjective: Chief Complaint  Patient presents with  . Right Hip - Follow-up    Right hip pinning 04/14/2020  . Right Shoulder - Follow-up    Right shoulder greater tuberosity fracture    HPI patient is a pleasant 69 year old female who comes in today almost 12 weeks out right hip pinning and right shoulder greater tuberosity fracture.  She has been doing very well in regards to both.  She has recently finished outpatient physical therapy.  She notes occasional soreness to her right shoulder but nothing more.  No pain to the right hip.     Objective: Vital Signs: Ht 5' 4.5" (1.638 m)   Wt 240 lb (108.9 kg)   BMI 40.56 kg/m     Ortho Exam right hip exam shows full range of motion and strength.  No pain with logroll.  Right shoulder shows mild tenderness to the fracture site.  Otherwise, near full and painless range of motion.   She is neurovascular intact distally.  Specialty Comments:  No specialty comments available.  Imaging: XR HIP UNILAT W OR W/O PELVIS 2-3 VIEWS RIGHT  Result Date: 07/09/2020 Nearly healed fracture  XR Shoulder Right  Result Date: 07/09/2020 Moderate bony consolidation    PMFS History: Patient Active Problem List   Diagnosis Date Noted  . Essential hypertension 04/14/2020  . 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 Arthritis   . Complication of anesthesia   . Fibromyalgia   . GERD (gastroesophageal reflux disease)   . Hypertension   . PONV (postoperative nausea and vomiting)   . Sleep apnea    uses mouth guard    History reviewed. No pertinent family history.  Past Surgical History:  Procedure Laterality Date  . ABDOMINAL HYSTERECTOMY  2001  . CARTILAGE SURGERY    . CESAREAN SECTION  1967  . CHOLECYSTECTOMY  2005  . HIP PINNING,CANNULATED Right 04/14/2020   Procedure: CANNULATED HIP PINNING;  Surgeon: Tarry Kos, MD;  Location: [facility] OR;  Service: Orthopedics;  Laterality: Right;  . MENISCUS REPAIR    . 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 TUBAL LIGATION  1984  . WRIST SURGERY Right    Torn Ligament   Social History   Occupational History  . Occupation: retired  Tobacco Use  . Smoking status: Never Smoker  . Smokeless tobacco: Never Used  Vaping Use  . Vaping Use: Never used  Substance and Sexual Activity  . Alcohol use: Never  . Drug use: Never  . Sexual activity: Not Currently

## 2020-08-16 ENCOUNTER — Encounter (HOSPITAL_COMMUNITY)
Admission: EM | Disposition: A | Discharge status: Home / Self Care | Payer: Self-pay | Source: Home / Self Care | Attending: Emergency Medicine

## 2020-08-16 ENCOUNTER — Emergency Department (HOSPITAL_COMMUNITY): Payer: Medicare PPO | Admitting: Certified Registered Nurse Anesthetist

## 2020-08-16 ENCOUNTER — Ambulatory Visit (HOSPITAL_COMMUNITY)
Admission: EM | Admit: 2020-08-16 | Discharge: 2020-08-16 | Disposition: A | Discharge status: Home / Self Care | Payer: Medicare PPO | Attending: Emergency Medicine | Admitting: Emergency Medicine

## 2020-08-16 ENCOUNTER — Other Ambulatory Visit: Payer: Self-pay

## 2020-08-16 ENCOUNTER — Encounter (HOSPITAL_COMMUNITY): Payer: Self-pay | Admitting: Emergency Medicine

## 2020-08-16 DIAGNOSIS — Z7901 Long term (current) use of anticoagulants: Secondary | ICD-10-CM | POA: Diagnosis not present

## 2020-08-16 DIAGNOSIS — K2100 Gastro-esophageal reflux disease with esophagitis, without bleeding: Secondary | ICD-10-CM | POA: Insufficient documentation

## 2020-08-16 DIAGNOSIS — T18128A Food in esophagus causing other injury, initial encounter: Secondary | ICD-10-CM | POA: Diagnosis not present

## 2020-08-16 DIAGNOSIS — Z7951 Long term (current) use of inhaled steroids: Secondary | ICD-10-CM | POA: Diagnosis not present

## 2020-08-16 DIAGNOSIS — I10 Essential (primary) hypertension: Secondary | ICD-10-CM | POA: Diagnosis not present

## 2020-08-16 DIAGNOSIS — Z79899 Other long term (current) drug therapy: Secondary | ICD-10-CM | POA: Diagnosis not present

## 2020-08-16 DIAGNOSIS — E7800 Pure hypercholesterolemia, unspecified: Secondary | ICD-10-CM | POA: Insufficient documentation

## 2020-08-16 DIAGNOSIS — T18108A Unspecified foreign body in esophagus causing other injury, initial encounter: Secondary | ICD-10-CM

## 2020-08-16 DIAGNOSIS — Z20822 Contact with and (suspected) exposure to covid-19: Secondary | ICD-10-CM | POA: Insufficient documentation

## 2020-08-16 DIAGNOSIS — K449 Diaphragmatic hernia without obstruction or gangrene: Secondary | ICD-10-CM | POA: Diagnosis not present

## 2020-08-16 DIAGNOSIS — K222 Esophageal obstruction: Secondary | ICD-10-CM | POA: Diagnosis not present

## 2020-08-16 DIAGNOSIS — R1310 Dysphagia, unspecified: Secondary | ICD-10-CM | POA: Insufficient documentation

## 2020-08-16 DIAGNOSIS — D509 Iron deficiency anemia, unspecified: Secondary | ICD-10-CM | POA: Diagnosis not present

## 2020-08-16 DIAGNOSIS — T17228A Food in pharynx causing other injury, initial encounter: Secondary | ICD-10-CM | POA: Insufficient documentation

## 2020-08-16 DIAGNOSIS — G4733 Obstructive sleep apnea (adult) (pediatric): Secondary | ICD-10-CM | POA: Insufficient documentation

## 2020-08-16 DIAGNOSIS — Z96651 Presence of right artificial knee joint: Secondary | ICD-10-CM | POA: Insufficient documentation

## 2020-08-16 DIAGNOSIS — R1319 Other dysphagia: Secondary | ICD-10-CM | POA: Diagnosis not present

## 2020-08-16 DIAGNOSIS — R0989 Other specified symptoms and signs involving the circulatory and respiratory systems: Secondary | ICD-10-CM | POA: Insufficient documentation

## 2020-08-16 DIAGNOSIS — X58XXXA Exposure to other specified factors, initial encounter: Secondary | ICD-10-CM | POA: Diagnosis not present

## 2020-08-16 DIAGNOSIS — Z8719 Personal history of other diseases of the digestive system: Secondary | ICD-10-CM

## 2020-08-16 HISTORY — PX: FOREIGN BODY REMOVAL: SHX962

## 2020-08-16 HISTORY — PX: ESOPHAGOGASTRODUODENOSCOPY (EGD) WITH PROPOFOL: SHX5813

## 2020-08-16 HISTORY — DX: Family history of other specified conditions: Z84.89

## 2020-08-16 LAB — RESP PANEL BY RT-PCR (FLU A&B, COVID) ARPGX2
Influenza A by PCR: NEGATIVE
Influenza B by PCR: NEGATIVE
SARS Coronavirus 2 by RT PCR: NEGATIVE

## 2020-08-16 SURGERY — ESOPHAGOGASTRODUODENOSCOPY (EGD) WITH PROPOFOL
Anesthesia: General

## 2020-08-16 MED ORDER — SUGAMMADEX SODIUM 200 MG/2ML IV SOLN
INTRAVENOUS | Status: DC | PRN
  Administered 2020-08-16: 200 mg via INTRAVENOUS

## 2020-08-16 MED ORDER — PHENYLEPHRINE 40 MCG/ML (10ML) SYRINGE FOR IV PUSH (FOR BLOOD PRESSURE SUPPORT)
PREFILLED_SYRINGE | INTRAVENOUS | Status: DC | PRN
  Administered 2020-08-16: 120 ug via INTRAVENOUS
  Administered 2020-08-16: 80 ug via INTRAVENOUS

## 2020-08-16 MED ORDER — ONDANSETRON HCL 4 MG/2ML IJ SOLN
INTRAMUSCULAR | Status: DC | PRN
  Administered 2020-08-16: 4 mg via INTRAVENOUS

## 2020-08-16 MED ORDER — PROPOFOL 10 MG/ML IV BOLUS
INTRAVENOUS | Status: DC | PRN
  Administered 2020-08-16: 200 mg via INTRAVENOUS

## 2020-08-16 MED ORDER — GLUCAGON HCL RDNA (DIAGNOSTIC) 1 MG IJ SOLR
1.0000 mg | Freq: Once | INTRAMUSCULAR | Status: AC
  Administered 2020-08-16: 1 mg via INTRAVENOUS
  Filled 2020-08-16: qty 1

## 2020-08-16 MED ORDER — LIDOCAINE 2% (20 MG/ML) 5 ML SYRINGE
INTRAMUSCULAR | Status: DC | PRN
  Administered 2020-08-16: 100 mg via INTRAVENOUS

## 2020-08-16 MED ORDER — PROPOFOL 500 MG/50ML IV EMUL
INTRAVENOUS | Status: DC | PRN
  Administered 2020-08-16: 125 ug/kg/min via INTRAVENOUS

## 2020-08-16 MED ORDER — LACTATED RINGERS IV SOLN
INTRAVENOUS | Status: DC | PRN
Start: 2020-08-16 — End: 2020-08-16

## 2020-08-16 MED ORDER — SUCCINYLCHOLINE CHLORIDE 200 MG/10ML IV SOSY
PREFILLED_SYRINGE | INTRAVENOUS | Status: DC | PRN
  Administered 2020-08-16: 100 mg via INTRAVENOUS

## 2020-08-16 MED ORDER — FENTANYL CITRATE (PF) 250 MCG/5ML IJ SOLN
INTRAMUSCULAR | Status: DC | PRN
  Administered 2020-08-16: 100 ug via INTRAVENOUS

## 2020-08-16 MED ORDER — ROCURONIUM BROMIDE 10 MG/ML (PF) SYRINGE
PREFILLED_SYRINGE | INTRAVENOUS | Status: DC | PRN
  Administered 2020-08-16: 20 mg via INTRAVENOUS

## 2020-08-16 SURGICAL SUPPLY — 14 items

## 2020-08-16 NOTE — ED Notes (Signed)
Pt's airway is intact. Pt is able to speak in full sentences. Pt is able to control mouth secretions.

## 2020-08-16 NOTE — Anesthesia Postprocedure Evaluation (Signed)
Anesthesia Post Note  Patient: Emmily Pellegrin Shakir  Procedure(s) Performed: ESOPHAGOGASTRODUODENOSCOPY (EGD) WITH PROPOFOL (N/A ) FOREIGN BODY REMOVAL (N/A )     Patient location during evaluation: PACU Anesthesia Type: General Level of consciousness: awake and alert Pain management: pain level controlled Vital Signs Assessment: post-procedure vital signs reviewed and stable Respiratory status: spontaneous breathing, nonlabored ventilation and respiratory function stable Cardiovascular status: blood pressure returned to baseline and stable Postop Assessment: no apparent nausea or vomiting Anesthetic complications: no   No complications documented.  Last Vitals:  Vitals:   08/16/20 1647 08/16/20 1657  BP: 109/61 118/60  Pulse: 78 74  Resp: 13 17  Temp: (!) 36.2 C   SpO2: 95% 95%    Last Pain:  Vitals:   08/16/20 1657  TempSrc:   PainSc: 0-No pain                 Lucretia Kern

## 2020-08-16 NOTE — ED Notes (Signed)
Report given to Carried, RN in endoscopy

## 2020-08-16 NOTE — ED Triage Notes (Signed)
Patient arrives to ED with complaints of x1 day of dysphagia. Pt states after eating yesterday she felt like food had gotten stuck in her throat. Pt states that shes not able to eat or drink today.

## 2020-08-16 NOTE — ED Notes (Signed)
Pt transported to endo and will be d/c'd from up there.

## 2020-08-16 NOTE — Op Note (Signed)
[facility] Patient Name: Kendra [name] Procedure Date : 08/16/2020 MRN: [id number] Attending MD: Meryl Dare , MD Date of Birth: 1951/03/22 CSN: [id number] Age: 69 Admit Type: Outpatient Procedure:                Upper GI endoscopy Indications:              Dysphagia, Food impaction in the esophagus. History                            of an esophageal stricture. Providers:                Venita Lick. Russella Dar, MD, Glory Rosebush, RN, Rosilyn Mings, Technician Referring MD:             Claudette Stapler, PA-C Medicines:                General Anesthesia Complications:            No immediate complications. Estimated Blood Loss:     Estimated blood loss: none. Procedure:                Pre-Anesthesia Assessment:                           - Prior to the procedure, a History and Physical                            was performed, and patient medications and                            allergies were reviewed. The patient's tolerance of                            previous anesthesia was also reviewed. The risks                            and benefits of the procedure and the sedation                            options and risks were discussed with the patient.                            All questions were answered, and informed consent                            was obtained. Prior Anticoagulants: The patient has                            taken no previous anticoagulant or antiplatelet                            agents. ASA Grade Assessment: II - A patient with  mild systemic disease. After reviewing the risks                            and benefits, the patient was deemed in                            satisfactory condition to undergo the procedure.                           After obtaining informed consent, the endoscope was                            passed under direct vision. Throughout the                             procedure, the patient's blood pressure, pulse, and                            oxygen saturations were monitored continuously. The                            GIF-H190 ([id number] Olympus gastroscope was                            introduced through the mouth, and advanced to the                            second part of duodenum. The upper GI endoscopy was                            accomplished without difficulty. The patient                            tolerated the procedure well. Scope In: Scope Out: Findings:      Food (meat) impaction was found in the distal esophagus. Removal of food       was accomplished with the tripod grasper. A large piece was removed       orally. Several smaller pieces gently pushed into the stomach under       direct vision.      One benign-appearing, intrinsic moderate stenosis was found 31 cm from       the incisors. This stenosis measured 1.1 cm (inner diameter) x less than       one cm (in length). The stenosis was traversed. Edema and friability at       the stricture.      A medium-sized hiatal hernia was present.      The exam of the stomach was otherwise normal.      The duodenal bulb and second portion of the duodenum were normal. Impression:               - Food impaction in the distal esophagus. Removal                            was successful.                           -  Benign-appearing esophageal stenosis. Edematous,                            friable.                           - Medium-sized hiatal hernia.                           - Normal duodenal bulb and second portion of the                            duodenum. Recommendation:           - Patient has a contact number available for                            emergencies. The signs and symptoms of potential                            delayed complications were discussed with the                            patient. Return to normal activities tomorrow.                            Written  discharge instructions were provided to the                            patient.                           - Full liquid diet today.                           - Advance diet to soft foods tomorrow and remain on                            a soft diet. No meat, bread, green beans until                            esophageal dilation has been completed.                           - Continue present medications.                           Kendra [name]- Follwo antireflux measures.                           - Follow up with her gastroenterologist, Dr. Karena AddisonMary                            Shearin, for consideration of esophageal dilation                            and further mgmt. Procedure  Code(s):        --- Professional ---                           [phone], Esophagogastroduodenoscopy, flexible,                            transoral; with removal of foreign body(s) Diagnosis Code(s):        --- Professional ---                           V40.086P, Food in esophagus causing other injury,                            initial encounter                           K22.2, Esophageal obstruction                           K44.9, Diaphragmatic hernia without obstruction or                            gangrene                           R13.10, Dysphagia, unspecified                           T18.108A, Unspecified foreign body in esophagus                            causing other injury, initial encounter CPT copyright 2019 [employer]. All rights reserved. The codes documented in this report are preliminary and upon coder review may  be revised to meet current compliance requirements. Meryl Dare, MD 08/16/2020 4:49:35 PM This report has been signed electronically. Number of Addenda: 0

## 2020-08-16 NOTE — Interval H&P Note (Signed)
History and Physical Interval Note:  08/16/2020 3:59 PM  Kendra [name]  has presented today for surgery, with the diagnosis of food impaction.  The various methods of treatment have been discussed with the patient and family. After consideration of risks, benefits and other options for treatment, the patient has consented to  Procedure(s): ESOPHAGOGASTRODUODENOSCOPY (EGD) WITH PROPOFOL (N/A) FOREIGN BODY REMOVAL (N/A) as a surgical intervention.  The patient's history has been reviewed, patient examined, no change in status, stable for surgery.  I have reviewed the patient's chart and labs.  Questions were answered to the patient's satisfaction.     Venita Lick. Russella Dar

## 2020-08-16 NOTE — Anesthesia Procedure Notes (Signed)
Procedure Name: Intubation Date/Time: 08/16/2020 4:16 PM Performed by: Dorthea Cove, CRNA Pre-anesthesia Checklist: Patient identified, Emergency Drugs available, Suction available and Patient being monitored Patient Re-evaluated:Patient Re-evaluated prior to induction Oxygen Delivery Method: Circle system utilized Preoxygenation: Pre-oxygenation with 100% oxygen Induction Type: IV induction and Rapid sequence Laryngoscope Size: Mac and 3 Grade View: Grade II Tube type: Oral Number of attempts: 1 Airway Equipment and Method: Stylet and Oral airway Placement Confirmation: ETT inserted through vocal cords under direct vision,  positive ETCO2 and breath sounds checked- equal and bilateral Secured at: 22 cm Tube secured with: Tape Dental Injury: Teeth and Oropharynx as per pre-operative assessment

## 2020-08-16 NOTE — Consult Note (Addendum)
                                                                           [facility] Consult: 1:31 PM 08/16/2020  LOS: 0 days    Referring Provider: Aberman PA-C in ED Primary Care Physician:  Martin, Maida, PA-C in [place] Primary Gastroenterologist: Mary Shearin MD, Nicholas Izaj PA-C at [facility] in [place].      Reason for Consultation:  Dysphagia, ? Food impaction.     HPI: Kendra [name] is a 69 y.o. female.  PMH OSA.  IDA. Depression. Raynaud's phenomenon. Cervical disc disease. Hypertension. GERD. Esophageal stricture. Elevated cholesterol. Surgeries include C-section, cholecystectomy, hysterectomy, oophorectomy and knee, hip, wrist surgeries  Previous evaluations for iron deficiency anemia:  04/2017 EGD revealed stricture at GEJ. Passage of scope resulted in mild tear and inadvertent dilation. Esophagitis, biopsies benign. Large HH. H. pylori negative gastritis. Normal duodenum with duodenal biopsies negative for celiac. 04/2017 colonoscopy. Removed 2 mm sessile rectosigmoid polyp (path: Glandular hyperplasia and small benign lymphoid nodule). Sigmoid diverticulosis. Internal hemorrhoids. 07/2017 EGD with dilation of esophageal stricture. Hiatal hernia and gastritis noted as well. No previous capsule endoscopy or esophagram's. As of GI office visit in 10/2019 she was continued pantoprazole 40 mg twice daily and was going to start sublingual B12 as well as oral iron and vitamin C supplements.  Has been compliant with her meds.  Mid afternoon yesterday she had a meal of chicken cacciatore and green beans.  She had acute development of dysphagia.  Since then she is not been tolerating p.o. intake and is also spitting up saliva.  She tried self inducing vomiting and retched but the food did not regurgitate.  She took her pills but retched these up last night.  Received  glucagon in the ED 2.5 hours ago but is still having difficulty handling her secretions. Pt does not describe a lead up to the yesterday's events of having dysphagia.  She avoids aspirin and NSAIDs because they upset her stomach  COVID-19 testing is negative    Past Medical History:  Diagnosis Date  . Arthritis   . Complication of anesthesia   . Fibromyalgia   . GERD (gastroesophageal reflux disease)   . Hypertension   . PONV (postoperative nausea and vomiting)   . Sleep apnea    uses mouth guard    Past Surgical History:  Procedure Laterality Date  . ABDOMINAL HYSTERECTOMY  2001  . CARTILAGE SURGERY    . CESAREAN SECTION  1967  . CHOLECYSTECTOMY  2005  . HIP PINNING,CANNULATED Right 04/14/2020   Procedure: CANNULATED HIP PINNING;  Surgeon: Xu, Naiping M, MD;  Location: [facility] OR;  Service: Orthopedics;  Laterality: Right;  . MENISCUS REPAIR    . TOTAL KNEE ARTHROPLASTY Right 09/12/2018   Procedure: TOTAL KNEE ARTHROPLASTY;  Surgeon: Lucey, Steve, MD;  Location: [facility];  Service: Orthopedics;  Laterality: Right;  . TUBAL LIGATION  1984  . WRIST SURGERY Right    Torn Ligament    Prior to Admission medications   Medication Sig Start Date End Date Taking? Authorizing Provider  amLODipine (NORVASC) 5 MG tablet Take 5 mg by mouth at bedtime. 02/03/20   [provider]  ascorbic acid (VITAMIN C) 500 MG tablet Take 500 mg by mouth in the morning.    [provider]  atorvastatin (LIPITOR) 20 MG tablet Take 20 mg by mouth at bedtime. 03/26/20   [provider]  BACLOFEN PO Take 1 tablet by mouth 2 (two) times daily as needed (for muscle spasms).    [provider]  budesonide-formoterol (SYMBICORT) 160-4.5 MCG/ACT inhaler Inhale 2 puffs into the lungs 2 (two) times daily as needed (for flares).  11/28/19   [provider]  Cholecalciferol (VITAMIN D3) 50 MCG (2000 UT) TABS Take 4,000 Units by mouth daily.    [provider]  Cyanocobalamin  (VITAMIN B-12) 500 MCG SUBL Place 500 mcg under the tongue daily.    [provider]  ELDERBERRY PO Take 1 capsule by mouth daily.    [provider]  enoxaparin (LOVENOX) 40 MG/0.4ML injection Inject 0.4 mLs (40 mg total) into the skin daily. 04/14/20   Tarry Kos, MD  Ferrous Sulfate (SLOW FE PO) Take 1 tablet by mouth daily with breakfast.    [provider]  fluticasone (FLONASE) 50 MCG/ACT nasal spray Place 1 spray into both nostrils 2 (two) times daily as needed for allergies or rhinitis.  02/15/20   [provider]  gabapentin (NEURONTIN) 400 MG capsule Take 400 mg by mouth 3 (three) times daily.    [provider]  KRILL OIL PO Take 1 capsule by mouth daily.    [provider]  loratadine (CLARITIN) 10 MG tablet Take 10 mg by mouth in the morning.    [provider]  montelukast (SINGULAIR) 10 MG tablet Take 10 mg by mouth at bedtime. 03/26/20   [provider]  Multiple Vitamins-Minerals (ONE-A-DAY WOMENS PO) Take 1 tablet by mouth daily with breakfast.    [provider]  OVER THE COUNTER MEDICATION Take 1 tablet by mouth See admin instructions. Calcium/Zinc tablet- Take 1 tablet by mouth once a day    [provider]  oxyCODONE-acetaminophen (PERCOCET) 5-325 MG tablet Take 1-2 tablets by mouth every 8 (eight) hours as needed for severe pain. Patient not taking: Reported on 07/09/2020 04/14/20   Tarry Kos, MD  pantoprazole (PROTONIX) 40 MG tablet Take 40 mg by mouth 2 (two) times daily.    [provider]  valsartan (DIOVAN) 160 MG tablet Take 160 mg by mouth in the morning. 04/04/20   [provider]    Scheduled Meds:  Infusions:  PRN Meds:    Allergies as of 08/16/2020 - Review Complete 08/16/2020  Allergen Reaction Noted  . Ibuprofen Other (See Comments) 06/08/2016  . Nsaids Other (See Comments) 08/24/2018  . Other Nausea And Vomiting 04/14/2020  . Codeine Nausea And  Vomiting 06/27/2015  . Hydrocodone-acetaminophen Nausea And Vomiting 06/08/2016  . Hydromorphone Itching 06/08/2016  . Morphine and related Nausea And Vomiting and Other (See Comments) 04/14/2020  . Oxycodone Other (See Comments) 04/14/2020  . Propoxyphene Nausea And Vomiting 06/08/2016  . Tizanidine Other (See Comments) 04/14/2020  . Tramadol hcl Nausea And Vomiting and Other (See Comments) 08/24/2018  . Prednisone Rash 06/08/2016    History reviewed. No pertinent family history.  Social History   Socioeconomic History  . Marital status: Widowed    Spouse name: Not on file  . Number of children: 2  . Years of education: 68  . Highest education level: Bachelor's degree (e.g., BA, AB, BS)  Occupational History  . Occupation: retired  Tobacco Use  .  Smoking status: Never Smoker  . Smokeless tobacco: Never Used  Vaping Use  . Vaping Use: Never used  Substance and Sexual Activity  . Alcohol use: Never  . Drug use: Never  . Sexual activity: Not Currently  Other Topics Concern  . Not on file  Social History Narrative  . Not on file   Social Determinants of Health   Financial Resource Strain: Not on file  Food Insecurity: Not on file  Transportation Needs: Not on file  Physical Activity: Not on file  Stress: Not on file  Social Connections: Not on file  Intimate Partner Violence: Not on file    REVIEW OF SYSTEMS: Constitutional: No weakness, no fatigue ENT:  No nose bleeds Pulm: No shortness of breath, no cough CV:  No palpitations, no LE edema.  GU:  No hematuria, no frequency GI: See HPI.  Reflux symptoms well controlled with twice daily PPI Heme: [place] unusual or excessive bleeding or bruising or use of any antiplatelets or anticoagulation meds.   Transfusions: None Neuro:  No headaches, no peripheral tingling or numbness Derm:  No itching, no rash or sores.  Endocrine:  No sweats or chills.  No polyuria or dysuria Immunization: Not queried.  Reviewed her  vaccination record but the COVID-19 status is unknown documented. Travel:  None beyond local counties in last few months.    PHYSICAL EXAM: Vital signs in last 24 hours: Vitals:   08/16/20 1230 08/16/20 1300  BP: (!) 157/66 (!) 148/63  Pulse: 83 74  Resp: 14 16  Temp:    SpO2: 98% 96%   Wt Readings from Last 3 Encounters:  07/09/20 108.9 kg  04/14/20 108.9 kg  09/12/18 102.1 kg    General: Pleasant, cooperative, obese, does not look ill or in distress Head: No facial asymmetry or swelling.  No signs of head trauma Eyes: No scleral icterus or conjunctival pallor. Ears: Not hard of hearing Nose: No congestion or discharge Mouth: Many molars are missing.  Remainder of teeth in good repair.  Mucosa is pink, clear, moist.  Tongue midline Neck: No JVD, no masses, no thyromegaly no tenderness. Lungs: Clear bilaterally without labored breathing or cough Heart: RRR.  No MRG.  S1, S2 present Abdomen: Obese, soft, nontender, nondistended.  Active bowel sounds.  No bruits, hernias, masses, organomegaly appreciated..   Rectal: Deferred Musc/Skeltl: No joint redness, swelling or significant deformities. Extremities: No CCE. Neurologic: Fully alert and oriented x3.  Good historian.  No tremors or obvious limb weakness but strength not tested.  Moves all 4 limbs. Skin: No rash, no sores, no telangiectasia Tattoos: None observed Nodes: Cervical adenopathy Psych: Pleasant, cooperative, calm.  Intake/Output from previous day: No intake/output data recorded. Intake/Output this shift: No intake/output data recorded.  LAB RESULTS: No results for input(s): WBC, HGB, HCT, PLT in the last 72 hours. BMET Lab Results  Component Value Date   NA 140 04/16/2020   NA 140 04/15/2020   NA 141 04/14/2020   K 4.5 04/16/2020   K 4.3 04/15/2020   K 3.8 04/14/2020   CL 104 04/16/2020   CL 104 04/15/2020   CL 102 04/14/2020   CO2 26 04/16/2020   CO2 27 04/15/2020   CO2 27 04/14/2020   GLUCOSE  118 (H) 04/16/2020   GLUCOSE 144 (H) 04/15/2020   GLUCOSE 135 (H) 04/14/2020   BUN 16 04/16/2020   BUN 10 04/15/2020   BUN 12 04/14/2020   CREATININE 0.77 04/16/2020   CREATININE 0.73 04/15/2020  CREATININE 0.79 04/14/2020   CALCIUM 8.7 (L) 04/16/2020   CALCIUM 8.6 (L) 04/15/2020   CALCIUM 9.3 04/14/2020     IMPRESSION:   *    Dysphagia and possible food impaction in patient w hx peptic esophageal stricture, dilated in 2018.   PLAN:     *    Patient has a slot for EGD around 4 PM today with Dr. Russella Dar keep her n.p.o.   Jennye Moccasin  08/16/2020, 1:31 PM Phone [phone]    Attending Physician Note   I have taken a history, examined the patient and reviewed the chart. I agree with the Advanced Practitioner's note, impression and recommendations.  Acute dysphagia, suspected food impaction. History of an esophageal stricture dilated in 2018.  EGD today.    Claudette Head, MD [facility]

## 2020-08-16 NOTE — Anesthesia Preprocedure Evaluation (Addendum)
Anesthesia Evaluation  Patient identified by MRN, date of birth, ID band Patient awake    Reviewed: Allergy & Precautions, NPO status , Patient's Chart, lab work & pertinent test results  History of Anesthesia Complications (+) PONV and history of anesthetic complications  Airway Mallampati: III  TM Distance: <3 FB Neck ROM: Full    Dental  (+) Teeth Intact, Chipped,    Pulmonary sleep apnea ,    breath sounds clear to auscultation       Cardiovascular hypertension, Pt. on medications  Rhythm:Regular Rate:Normal     Neuro/Psych negative psych ROS   GI/Hepatic Neg liver ROS, GERD  Medicated,  Endo/Other  negative endocrine ROS  Renal/GU negative Renal ROS     Musculoskeletal  (+) Arthritis , Fibromyalgia -  Abdominal Normal abdominal exam  (+)   Peds  Hematology negative hematology ROS (+)   Anesthesia Other Findings   Reproductive/Obstetrics                            Anesthesia Physical Anesthesia Plan  ASA: II  Anesthesia Plan: General   Post-op Pain Management:    Induction: Intravenous, Rapid sequence and Cricoid pressure planned  PONV Risk Score and Plan: 3 and Ondansetron, Dexamethasone and Treatment may vary due to age or medical condition  Airway Management Planned: Oral ETT  Additional Equipment: None  Intra-op Plan:   Post-operative Plan: Extubation in OR  Informed Consent: I have reviewed the patients History and Physical, chart, labs and discussed the procedure including the risks, benefits and alternatives for the proposed anesthesia with the patient or authorized representative who has indicated his/her understanding and acceptance.       Plan Discussed with: CRNA  Anesthesia Plan Comments:        Anesthesia Quick Evaluation

## 2020-08-16 NOTE — Discharge Instructions (Signed)
Swallowed Foreign Body, Adult  A swallowed foreign body means that you swallowed something and it got stuck. It might be food or something else. The object may get stuck in the part of your body that moves food from your mouth to your stomach (esophagus), or it may get stuck in another part of your belly (digestive tract). Often, the object will pass through your body on its own. The object may need to be taken out by a doctor if it is dangerous or if it will not pass through your body on its own. Objects may be swallowed by accident or on purpose. It is very important to tell your doctor what you swallowed. Some swallowed objects can be very dangerous. You may need emergency treatment if:  An object gets stuck in your throat.  You cannot swallow.  You cannot breathe well.  The object is sharp.  The object is harmful or poisonous (toxic). What are the causes? The most common cause is food that will not pass through the part of your body that moves food from your mouth to your stomach. When this happens, it is called food impaction. Foods that may cause this include:  Meat.  Hard vegetables, such as carrots and radishes. Other common swallowed objects include:  Pieces of bone from meat or fish.  Toothpicks.  Dentures. What increases the risk?  Wearing dentures.  Having been drinking alcohol or taking drugs.  Having a mental health condition.  Having trouble with thinking and learning (cognitive impairment).  Having a narrowed or scarred area in your belly. What are the signs or symptoms?  Pain or pressure in your throat or chest.  Not being able to swallow food or liquid.  Not being able to swallow your spit (saliva).  Drooling.  Choking.  A hoarse voice.  Trouble breathing.  Noisy breathing.  Vomit that has blood in it. How is this treated? No treatment is needed if the object is not dangerous and will come out (pass) in your poop (stool). If the swallowed  object is not dangerous, but it is stuck in the part of your body that moves food from your mouth to your stomach:  Your doctor may gently suction out the object through your mouth.  You may be given a medicine to relax the muscles and allow the object to pass through to the stomach.  A procedure called endoscopy may be done to find and remove the object if it does not come out with medicine. Your doctor will put medical tools through a tube (endoscope) to remove the object. You may need emergency treatment if:  The object is causing you to breathe in (inhale) spit into your lungs (aspirate).  The object is pressing on your airway. This makes it hard for you to breathe.  The object can harm the inside of your belly. Some objects that can cause harm include batteries, magnets, sharp objects, and drugs. Follow these instructions at home: Caring for yourself  If the object in your belly is expected to come out in your poop: ? Eat what you normally eat if your doctor says that you can. ? Keep checking your poop to see if the object has come out of your body. ? Call your doctor if the object has not come out of your body after 3 days.  If you had a procedure to remove the object, follow instructions from your doctor about caring for yourself after the procedure. General instructions  Take over-the-counter and  prescription medicines only as told by your doctor.  Keep all follow-up visits as told by your doctor. This is important. How is this prevented?  Cut your food into small pieces.  Always sit upright while eating.  Remove bones from meat and fish.  Chew your food well before swallowing.  Do not talk, laugh, or walk around while eating or swallowing. Contact a doctor if:  You still have problems after you have been treated.  The object has not come out of your body after 3 days. Get help right away if:  You have a fever.  You have pain in your chest or your belly.  You  cough up blood.  You have blood in your poop.  You have blood in your vomit after treatment. Summary  A swallowed foreign body means that you swallowed something and it got stuck.  The object may get stuck in the part of the body that moves food from your mouth to your stomach (esophagus), or it may get stuck in another part of your belly (digestive tract).  Often, the object will pass through your body on its own.  An endoscopy may be done to find and remove the object if it does not pass out of your body after you take medicine.  Call your doctor if the object has not come out of your body after 3 days, you have blood in your poop, or you have blood when you cough or vomit. This information is not intended to replace advice given to you by your health care provider. Make sure you discuss any questions you have with your health care provider. Document Revised: 07/07/2018 Document Reviewed: 07/07/2018 Elsevier Patient Education  2020 [employer].

## 2020-08-16 NOTE — ED Provider Notes (Signed)
[facility] EMERGENCY DEPARTMENT Provider Note   CSN: [id number] Arrival date & time: 08/16/20  1041     History Chief Complaint  Patient presents with  . Dysphagia    Kendra [name] is a 69 y.o. female with a past medical history significant for fibromyalgia, GERD, and hypertension who presents to the ED due to a possible food bolus.  Patient states she was eating lunch yesterday where she was eating chicken and green beans and felt like the food got stuck in her throat. Since lunch yesterday, she has not been able to swallow any solids or liquids. She notes she is also having difficulties swallowing her own saliva, but denies drooling. Denies difficulties breathing. She states she has spit up everything she has taken by mouth in the last 24 hours. She attempted the heimlich maneuver on herself with no relief of symptoms. She has had 2 previous esophageal dilatation procedures in the past. She sees Dr. Lanae Boast with [facility] in [facility]. Denies any previous food impactions. Denies fever and chills. Denies abdominal pain. Admits to foreign body sensation in throat. No treatment prior to arrival. Symptoms are worse when attempting po intake.   History obtained from patient and past medical records. No interpreter used during encounter.      Past Medical History:  Diagnosis Date  . Arthritis   . Complication of anesthesia   . Family history of adverse reaction to anesthesia   . Fibromyalgia   . GERD (gastroesophageal reflux disease)   . Hypertension   . PONV (postoperative nausea and vomiting)   . Sleep apnea    uses mouth guard    Patient Active Problem List   Diagnosis Date Noted  . Essential hypertension 04/14/2020  . 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 ABDOMINAL HYSTERECTOMY  2001  . CARTILAGE SURGERY    . CESAREAN SECTION  1967  . CHOLECYSTECTOMY  2005  . HIP PINNING,CANNULATED Right  04/14/2020   Procedure: CANNULATED HIP PINNING;  Surgeon: Tarry Kos, MD;  [facility]: [facility];  Service: Orthopedics;  Laterality: Right;  . MENISCUS REPAIR    . 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 TUBAL LIGATION  1984  . WRIST SURGERY Right    Torn Ligament     OB History   No obstetric history on file.     History reviewed. No pertinent family history.  Social History   Tobacco Use  . Smoking status: Never Smoker  . Smokeless tobacco: Never Used  Vaping Use  . Vaping Use: Never used  Substance Use Topics  . Alcohol use: Never  . Drug use: Never    Home Medications Prior to Admission medications   Medication Sig Start Date End Date Taking? Authorizing Provider  amLODipine (NORVASC) 5 MG tablet Take 5 mg by mouth at bedtime. 02/03/20  Yes [provider]  ascorbic acid (VITAMIN C) 500 MG tablet Take 500 mg by mouth in the morning.   Yes [provider]  atorvastatin (LIPITOR) 20 MG tablet Take 20 mg by mouth at bedtime. 03/26/20  Yes [provider]  budesonide-formoterol (SYMBICORT) 160-4.5 MCG/ACT inhaler Inhale 2 puffs into the lungs 2 (two) times daily as needed (for flares).  11/28/19  Yes [provider]  Cholecalciferol (VITAMIN D3) 50 MCG (2000 UT)  TABS Take 4,000 Units by mouth daily.   Yes [provider]  Cyanocobalamin (VITAMIN B-12) 500 MCG SUBL Place 500 mcg under the tongue daily.   Yes [provider]  Ferrous Sulfate (SLOW FE PO) Take 1 tablet by mouth daily with breakfast.   Yes [provider]  fluticasone (FLONASE) 50 MCG/ACT nasal spray Place 1 spray into both nostrils 2 (two) times daily as needed for allergies or rhinitis.  02/15/20  Yes [provider]  gabapentin (NEURONTIN) 400 MG capsule Take 400 mg by mouth 3 (three) times daily.   Yes [provider]  KRILL OIL PO Take 1 capsule by mouth daily.   Yes [provider]  loratadine (CLARITIN) 10 MG tablet Take 10 mg by mouth in the morning.   Yes [provider]  montelukast (SINGULAIR) 10 MG tablet Take 10 mg by mouth at bedtime. 03/26/20  Yes [provider]  Multiple Vitamins-Minerals (ONE-A-DAY WOMENS PO) Take 1 tablet by mouth daily with breakfast.   Yes [provider]  OVER THE COUNTER MEDICATION Take 1 tablet by mouth See admin instructions. Calcium/Zinc tablet- Take 1 tablet by mouth once a day   Yes [provider]  pantoprazole (PROTONIX) 40 MG tablet Take 40 mg by mouth 2 (two) times daily.   Yes [provider]  valsartan (DIOVAN) 160 MG tablet Take 160 mg by mouth in the morning. 04/04/20  Yes [provider]  BACLOFEN PO Take 1 tablet by mouth 2 (two) times daily as needed (for muscle spasms).    [provider]  ELDERBERRY PO Take 1 capsule by mouth daily.    [provider]  enoxaparin (LOVENOX) 40 MG/0.4ML injection Inject 0.4 mLs (40 mg total) into the skin daily. 04/14/20   Tarry Kos, MD  oxyCODONE-acetaminophen (PERCOCET) 5-325 MG tablet Take 1-2 tablets by mouth every 8 (eight) hours as needed for severe pain. Patient not taking: No sig reported 04/14/20   Tarry Kos, MD    Allergies    Ibuprofen, Nsaids, Other, Codeine, Hydrocodone-acetaminophen, Hydromorphone, Morphine and related, Oxycodone, Propoxyphene, Tizanidine, Tramadol hcl, and Prednisone  Review of Systems   Review of Systems  Constitutional: Negative for chills and fever.  HENT: Positive for trouble swallowing. Negative for drooling and voice change.   Respiratory: Negative for choking and shortness of breath.   Cardiovascular: Negative for chest pain.  Gastrointestinal: Positive for vomiting. Negative for abdominal pain.  All other systems reviewed and are negative.   Physical Exam Updated Vital Signs BP  (!) 160/88   Pulse 75   Temp 99 F (37.2 C) (Oral)   Resp 18   Ht 5\' 5"  (1.651 m)   Wt 104.3 kg   SpO2 97%   BMI 38.27 kg/m   Physical Exam Vitals and nursing note reviewed.  Constitutional:      General: She is not in acute distress.    Appearance: She is not ill-appearing.     Comments: Patient appears comfortable in bed. Speaking in full sentences. No drooling.   HENT:     Head: Normocephalic.     Mouth/Throat:     Comments: Airway patent Eyes:     Pupils: Pupils are equal, round, and reactive to light.  Cardiovascular:     Rate and Rhythm: Normal rate and regular rhythm.     Pulses: Normal pulses.     Heart sounds: Normal heart sounds. No murmur heard. No friction rub. No gallop.   Pulmonary:  Effort: Pulmonary effort is normal.     Breath sounds: Normal breath sounds.     Comments: Respirations equal and unlabored, patient able to speak in full sentences, lungs clear to auscultation bilaterally. No wheeze or stridor.  Abdominal:     General: Abdomen is flat. Bowel sounds are normal. There is no distension.     Palpations: Abdomen is soft.     Tenderness: There is no abdominal tenderness. There is no guarding or rebound.  Musculoskeletal:     Cervical back: Neck supple.     Comments: Able to move all 4 extremities without difficulty.   Skin:    General: Skin is warm and dry.  Neurological:     General: No focal deficit present.     Mental Status: She is alert.  Psychiatric:        Mood and Affect: Mood normal.        Behavior: Behavior normal.     ED Results / Procedures / Treatments   Labs (all labs ordered are listed, but only abnormal results are displayed) Labs Reviewed  RESP PANEL BY RT-PCR (FLU A&B, COVID) ARPGX2    EKG None  Radiology No results found.  Procedures Procedures (including critical care time)  Medications Ordered in ED Medications  glucagon (human recombinant) (GLUCAGEN) injection 1 mg (1 mg Intravenous Given 08/16/20  1122)    ED Course  I have reviewed the triage vital signs and the nursing notes.  Pertinent labs & imaging results that were available during my care of the patient were reviewed by me and considered in my medical decision making (see chart for details).  Clinical Course as of 08/16/20 1633  Fri Aug 16, 2020  1327 Discussed case with Jennye Moccasin, PA-C with [facility] who will see patient here in the ED.  [CA]    Clinical Course User Index [CA] Mannie Stabile, PA-C   MDM Rules/Calculators/A&P                         69 year old female presents to the ED due to a possible food bolus impaction after eating lunch yesterday.  Patient unable to take anything by p.o. at this time.  No previous food bolus.  Admits to having previous esophageal dilatation procedures in the past.  Patient denies difficulties breathing.  Upon arrival, stable vitals.  Patient in no acute distress and nontoxic-appearing.  Patient appears comfortable in bed with no drooling.  Airway patent.  Lungs clear to auscultation bilaterally with no wheeze or stridor.  Will attempt glucagon.  GI consulted for further evaluation.  Covid test pending.  Discussed case with Dr. Jeraldine Loots who agrees with assessment and plan.  Discussed case with GI. See note above. Patient taken to EGD and will be discharged from upstairs.  Covid test negative. Final Clinical Impression(s) / ED Diagnoses Final diagnoses:  Food impaction of esophagus, initial encounter    Rx / DC Orders ED Discharge Orders    None       Jesusita Oka 08/16/20 1633    Gerhard Munch, MD 08/16/20 1700

## 2020-08-16 NOTE — H&P (View-Only) (Signed)
[facility] Consult: 1:31 PM 08/16/2020  LOS: 0 days    Referring Provider: Jerel ShepherdAberman PA-C in ED Primary Care Physician:  Stoney BangMartin, Maida, PA-C in [facility] Primary Gastroenterologist: Karena AddisonMary Shearin MD, Marliss CootsNicholas Izaj PA-C at [facility].      Reason for Consultation:  Dysphagia, ? Food impaction.     HPI: Kendra [name] is a 69 y.o. female.  PMH OSA.  IDA. Depression. Raynaud's phenomenon. Cervical disc disease. Hypertension. GERD. Esophageal stricture. Elevated cholesterol. Surgeries include C-section, cholecystectomy, hysterectomy, oophorectomy and knee, hip, wrist surgeries  Previous evaluations for iron deficiency anemia:  04/2017 EGD revealed stricture at GEJ. Passage of scope resulted in mild tear and inadvertent dilation. Esophagitis, biopsies benign. Large HH. H. pylori negative gastritis. Normal duodenum with duodenal biopsies negative for celiac. 04/2017 colonoscopy. Removed 2 mm sessile rectosigmoid polyp (path: Glandular hyperplasia and small benign lymphoid nodule). Sigmoid diverticulosis. Internal hemorrhoids. 07/2017 EGD with dilation of esophageal stricture. Hiatal hernia and gastritis noted as well. No previous capsule endoscopy or esophagram's. As of GI office visit in 10/2019 she was continued pantoprazole 40 mg twice daily and was going to start sublingual B12 as well as oral iron and vitamin C supplements.  Has been compliant with her meds.  Mid afternoon yesterday she had a meal of chicken cacciatore and green beans.  She had acute development of dysphagia.  Since then she is not been tolerating p.o. intake and is also spitting up saliva.  She tried self inducing vomiting and retched but the food did not regurgitate.  She took her pills but retched these up last night.  Received  glucagon in the ED 2.5 hours ago but is still having difficulty handling her secretions. Pt does not describe a lead up to the yesterday's events of having dysphagia.  She avoids aspirin and NSAIDs because they upset her stomach  COVID-19 testing is negative    Past Medical History:  Diagnosis Date  . Arthritis   . Complication of anesthesia   . Fibromyalgia   . GERD (gastroesophageal reflux disease)   . Hypertension   . PONV (postoperative nausea and vomiting)   . Sleep apnea    uses mouth guard    Past Surgical History:  Procedure Laterality Date  . ABDOMINAL HYSTERECTOMY  2001  . CARTILAGE SURGERY    . CESAREAN SECTION  1967  . CHOLECYSTECTOMY  2005  . HIP PINNING,CANNULATED Right 04/14/2020   Procedure: CANNULATED HIP PINNING;  Surgeon: Tarry KosXu, Naiping M, MD;  Location: [facility] OR;  Service: Orthopedics;  Laterality: Right;  . MENISCUS REPAIR    . TOTAL KNEE ARTHROPLASTY Right 09/12/2018   Procedure: TOTAL KNEE ARTHROPLASTY;  Surgeon: Dannielle HuhLucey, Steve, MD;  Location: [facility];  Service: Orthopedics;  Laterality: Right;  . TUBAL LIGATION  1984  . WRIST SURGERY Right    Torn Ligament    Prior to Admission medications   Medication Sig Start Date End Date Taking? Authorizing Provider  amLODipine (NORVASC) 5 MG tablet Take 5 mg by mouth at bedtime. 02/03/20   [provider]  ascorbic acid (VITAMIN C) 500 MG tablet Take 500 mg by mouth in the morning.    [provider]  atorvastatin (LIPITOR) 20 MG tablet Take 20 mg by mouth at bedtime. 03/26/20   [provider]  BACLOFEN PO Take 1 tablet by mouth 2 (two) times daily as needed (for muscle spasms).    [provider]  budesonide-formoterol (SYMBICORT) 160-4.5 MCG/ACT inhaler Inhale 2 puffs into the lungs 2 (two) times daily as needed (for flares).  11/28/19   [provider]  Cholecalciferol (VITAMIN D3) 50 MCG (2000 UT) TABS Take 4,000 Units by mouth daily.    [provider]  Cyanocobalamin  (VITAMIN B-12) 500 MCG SUBL Place 500 mcg under the tongue daily.    [provider]  ELDERBERRY PO Take 1 capsule by mouth daily.    [provider]  enoxaparin (LOVENOX) 40 MG/0.4ML injection Inject 0.4 mLs (40 mg total) into the skin daily. 04/14/20   Tarry Kos, MD  Ferrous Sulfate (SLOW FE PO) Take 1 tablet by mouth daily with breakfast.    [provider]  fluticasone (FLONASE) 50 MCG/ACT nasal spray Place 1 spray into both nostrils 2 (two) times daily as needed for allergies or rhinitis.  02/15/20   [provider]  gabapentin (NEURONTIN) 400 MG capsule Take 400 mg by mouth 3 (three) times daily.    [provider]  KRILL OIL PO Take 1 capsule by mouth daily.    [provider]  loratadine (CLARITIN) 10 MG tablet Take 10 mg by mouth in the morning.    [provider]  montelukast (SINGULAIR) 10 MG tablet Take 10 mg by mouth at bedtime. 03/26/20   [provider]  Multiple Vitamins-Minerals (ONE-A-DAY WOMENS PO) Take 1 tablet by mouth daily with breakfast.    [provider]  OVER THE COUNTER MEDICATION Take 1 tablet by mouth See admin instructions. Calcium/Zinc tablet- Take 1 tablet by mouth once a day    [provider]  oxyCODONE-acetaminophen (PERCOCET) 5-325 MG tablet Take 1-2 tablets by mouth every 8 (eight) hours as needed for severe pain. Patient not taking: Reported on 07/09/2020 04/14/20   Tarry Kos, MD  pantoprazole (PROTONIX) 40 MG tablet Take 40 mg by mouth 2 (two) times daily.    [provider]  valsartan (DIOVAN) 160 MG tablet Take 160 mg by mouth in the morning. 04/04/20   [provider]    Scheduled Meds:  Infusions:  PRN Meds:    Allergies as of 08/16/2020 - Review Complete 08/16/2020  Allergen Reaction Noted  . Ibuprofen Other (See Comments) 06/08/2016  . Nsaids Other (See Comments) 08/24/2018  . Other Nausea And Vomiting 04/14/2020  . Codeine Nausea And  Vomiting 06/27/2015  . Hydrocodone-acetaminophen Nausea And Vomiting 06/08/2016  . Hydromorphone Itching 06/08/2016  . Morphine and related Nausea And Vomiting and Other (See Comments) 04/14/2020  . Oxycodone Other (See Comments) 04/14/2020  . Propoxyphene Nausea And Vomiting 06/08/2016  . Tizanidine Other (See Comments) 04/14/2020  . Tramadol hcl Nausea And Vomiting and Other (See Comments) 08/24/2018  . Prednisone Rash 06/08/2016    History reviewed. No pertinent family history.  Social History   Socioeconomic History  . Marital status: Widowed    Spouse name: Not on file  . Number of children: 2  . Years of education: 68  . Highest education level: Bachelor's degree (e.g., BA, AB, BS)  Occupational History  . Occupation: retired  Tobacco Use  .  Smoking status: Never Smoker  . Smokeless tobacco: Never Used  Vaping Use  . Vaping Use: Never used  Substance and Sexual Activity  . Alcohol use: Never  . Drug use: Never  . Sexual activity: Not Currently  Other Topics Concern  . Not on file  Social History Narrative  . Not on file   Social Determinants of Health   Financial Resource Strain: Not on file  Food Insecurity: Not on file  Transportation Needs: Not on file  Physical Activity: Not on file  Stress: Not on file  Social Connections: Not on file  Intimate Partner Violence: Not on file    REVIEW OF SYSTEMS: Constitutional: No weakness, no fatigue ENT:  No nose bleeds Pulm: No shortness of breath, no cough CV:  No palpitations, no LE edema.  GU:  No hematuria, no frequency GI: See HPI.  Reflux symptoms well controlled with twice daily PPI Heme: [place] unusual or excessive bleeding or bruising or use of any antiplatelets or anticoagulation meds.   Transfusions: None Neuro:  No headaches, no peripheral tingling or numbness Derm:  No itching, no rash or sores.  Endocrine:  No sweats or chills.  No polyuria or dysuria Immunization: Not queried.  Reviewed her  vaccination record but the COVID-19 status is unknown documented. Travel:  None beyond local counties in last few months.    PHYSICAL EXAM: Vital signs in last 24 hours: Vitals:   08/16/20 1230 08/16/20 1300  BP: (!) 157/66 (!) 148/63  Pulse: 83 74  Resp: 14 16  Temp:    SpO2: 98% 96%   Wt Readings from Last 3 Encounters:  07/09/20 108.9 kg  04/14/20 108.9 kg  09/12/18 102.1 kg    General: Pleasant, cooperative, obese, does not look ill or in distress Head: No facial asymmetry or swelling.  No signs of head trauma Eyes: No scleral icterus or conjunctival pallor. Ears: Not hard of hearing Nose: No congestion or discharge Mouth: Many molars are missing.  Remainder of teeth in good repair.  Mucosa is pink, clear, moist.  Tongue midline Neck: No JVD, no masses, no thyromegaly no tenderness. Lungs: Clear bilaterally without labored breathing or cough Heart: RRR.  No MRG.  S1, S2 present Abdomen: Obese, soft, nontender, nondistended.  Active bowel sounds.  No bruits, hernias, masses, organomegaly appreciated..   Rectal: Deferred Musc/Skeltl: No joint redness, swelling or significant deformities. Extremities: No CCE. Neurologic: Fully alert and oriented x3.  Good historian.  No tremors or obvious limb weakness but strength not tested.  Moves all 4 limbs. Skin: No rash, no sores, no telangiectasia Tattoos: None observed Nodes: Cervical adenopathy Psych: Pleasant, cooperative, calm.  Intake/Output from previous day: No intake/output data recorded. Intake/Output this shift: No intake/output data recorded.  LAB RESULTS: No results for input(s): WBC, HGB, HCT, PLT in the last 72 hours. BMET Lab Results  Component Value Date   NA 140 04/16/2020   NA 140 04/15/2020   NA 141 04/14/2020   K 4.5 04/16/2020   K 4.3 04/15/2020   K 3.8 04/14/2020   CL 104 04/16/2020   CL 104 04/15/2020   CL 102 04/14/2020   CO2 26 04/16/2020   CO2 27 04/15/2020   CO2 27 04/14/2020   GLUCOSE  118 (H) 04/16/2020   GLUCOSE 144 (H) 04/15/2020   GLUCOSE 135 (H) 04/14/2020   BUN 16 04/16/2020   BUN 10 04/15/2020   BUN 12 04/14/2020   CREATININE 0.77 04/16/2020   CREATININE 0.73 04/15/2020  CREATININE 0.79 04/14/2020   CALCIUM 8.7 (L) 04/16/2020   CALCIUM 8.6 (L) 04/15/2020   CALCIUM 9.3 04/14/2020     IMPRESSION:   *    Dysphagia and possible food impaction in patient w hx peptic esophageal stricture, dilated in 2018.   PLAN:     *    Patient has a slot for EGD around 4 PM today with Dr. Russella Dar keep her n.p.o.   Jennye Moccasin  08/16/2020, 1:31 PM Phone [phone]    Attending Physician Note   I have taken a history, examined the patient and reviewed the chart. I agree with the Advanced Practitioner's note, impression and recommendations.  Acute dysphagia, suspected food impaction. History of an esophageal stricture dilated in 2018.  EGD today.    Claudette Head, MD [facility]

## 2020-08-16 NOTE — Transfer of Care (Signed)
Immediate Anesthesia Transfer of Care Note  Patient: Kendra [name]  Procedure(s) Performed: ESOPHAGOGASTRODUODENOSCOPY (EGD) WITH PROPOFOL (N/A ) FOREIGN BODY REMOVAL (N/A )  Patient Location: Endoscopy Unit  Anesthesia Type:General  Level of Consciousness: awake, alert  and oriented  Airway & Oxygen Therapy: Patient Spontanous Breathing  Post-op Assessment: Report given to RN and Post -op Vital signs reviewed and stable  Post vital signs: Reviewed and stable  Last Vitals:  Vitals Value Taken Time  BP 109/61 08/16/20 1647  Temp    Pulse 79 08/16/20 1648  Resp 17 08/16/20 1648  SpO2 92 % 08/16/20 1648  Vitals shown include unvalidated device data.  Last Pain:  Vitals:   08/16/20 1503  TempSrc:   PainSc: 0-No pain         Complications: No complications documented.

## 2020-08-18 ENCOUNTER — Encounter (HOSPITAL_COMMUNITY): Payer: Self-pay | Admitting: Gastroenterology

## 2020-10-08 ENCOUNTER — Ambulatory Visit (INDEPENDENT_AMBULATORY_CARE_PROVIDER_SITE_OTHER): Payer: Medicare PPO | Admitting: Orthopaedic Surgery

## 2020-10-08 ENCOUNTER — Other Ambulatory Visit: Payer: Self-pay

## 2020-10-08 ENCOUNTER — Encounter: Payer: Self-pay | Admitting: Orthopaedic Surgery

## 2020-10-08 ENCOUNTER — Ambulatory Visit (INDEPENDENT_AMBULATORY_CARE_PROVIDER_SITE_OTHER): Payer: Medicare PPO

## 2020-10-08 VITALS — Ht 65.0 in | Wt 230.0 lb

## 2020-10-08 DIAGNOSIS — M25551 Pain in right hip: Secondary | ICD-10-CM

## 2020-10-08 DIAGNOSIS — M25511 Pain in right shoulder: Secondary | ICD-10-CM

## 2020-10-08 NOTE — Progress Notes (Signed)
   Post-Op Visit Note   Patient: Kendra [name]           Date of Birth: 07-29-1951           MRN: [id number] Visit Date: 10/08/2020 PCP: Stoney Bang, PA-C   Assessment & Plan:   Chief Complaint:  Chief Complaint  Patient presents with  . Right Shoulder - Follow-up  . Right Hip - Follow-up   Visit Diagnoses:  1. Right hip pain   2. Acute pain of right shoulder     Plan:   Kendra [name] is status post screw fixation femoral neck fracture as well as nonoperative treatment greater tuberosity fracture in August.  Overall doing well.  She has some aching in her shoulder at nighttime.  She is very active and moving firewood.  Right shoulder right hip exams are pretty much unremarkable.  She has good range of motion and good strength.  From my standpoint she has recovered from her surgeries and healed her fractures.  We will see her back as needed.  Activity as tolerated.  Follow-Up Instructions: Return if symptoms worsen or fail to improve.   Orders:  Orders Placed This Encounter  Procedures  . XR Shoulder Right  . XR Pelvis 1-2 Views   No orders of the defined types were placed in this encounter.   Imaging: XR Pelvis 1-2 Views  Result Date: 10/08/2020 Stable fixation of femoral neck fracture.  Healed fracture.  XR Shoulder Right  Result Date: 10/08/2020 Healed greater tuberosity fracture   PMFS History: Patient Active Problem List   Diagnosis Date Noted  . Food impaction of esophagus   . Esophageal dysphagia   . Essential hypertension 04/14/2020  . 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 Arthritis   . Complication of anesthesia   . Family history of adverse reaction to anesthesia   . Fibromyalgia   . GERD (gastroesophageal reflux disease)   . Hypertension   . PONV (postoperative nausea and vomiting)   . Sleep apnea    uses mouth guard    No family history on file.  Past Surgical History:  Procedure Laterality Date   . ABDOMINAL HYSTERECTOMY  2001  . CARTILAGE SURGERY    . CESAREAN SECTION  1967  . CHOLECYSTECTOMY  2005  . ESOPHAGOGASTRODUODENOSCOPY (EGD) WITH PROPOFOL N/A 08/16/2020   Procedure: ESOPHAGOGASTRODUODENOSCOPY (EGD) WITH PROPOFOL;  Surgeon: Meryl Dare, MD;  Location: [facility] ENDOSCOPY;  Service: Endoscopy;  Laterality: N/A;  . FOREIGN BODY REMOVAL N/A 08/16/2020   Procedure: FOREIGN BODY REMOVAL;  Surgeon: Meryl Dare, MD;  Location: [facility] ENDOSCOPY;  Service: Endoscopy;  Laterality: N/A;  . HIP PINNING,CANNULATED Right 04/14/2020   Procedure: CANNULATED HIP PINNING;  Surgeon: Tarry Kos, MD;  Location: [facility] OR;  Service: Orthopedics;  Laterality: Right;  . MENISCUS REPAIR    . 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 TUBAL LIGATION  1984  . WRIST SURGERY Right    Torn Ligament   Social History   Occupational History  . Occupation: retired  Tobacco Use  . Smoking status: Never Smoker  . Smokeless tobacco: Never Used  Vaping Use  . Vaping Use: Never used  Substance and Sexual Activity  . Alcohol use: Never  . Drug use: Never  . Sexual activity: Not Currently

## 2021-07-03 IMAGING — CT CT HIP*R* W/O CM
2 of 4 series · 17 of 46 positions shown, 19 images · non-contrast
Comparison: X-ray 04/13/2020

CLINICAL DATA: Fall, right hip fracture

EXAM:
CT OF THE RIGHT HIP WITHOUT CONTRAST
TECHNIQUE: Multidetector CT imaging of the right hip was performed according to
the standard protocol. Multiplanar CT image reconstructions were
also generated.

[Series 6: pelvis thin · axial · 0.47mm/px · z∈[+763,+988]mm · 14 of 411 slices shown, 16 images]
[im 18/411  soft-tissue]
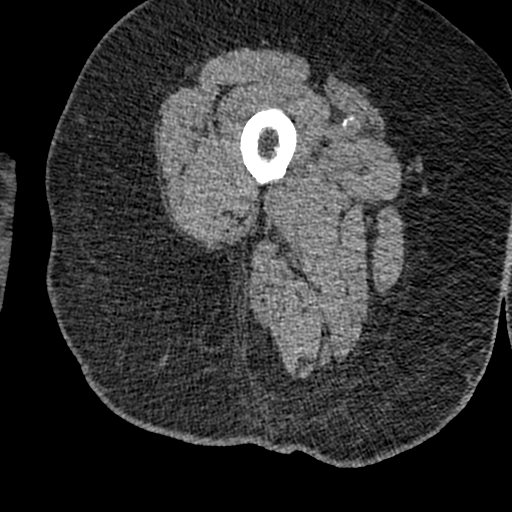
[im 18/411  bone]
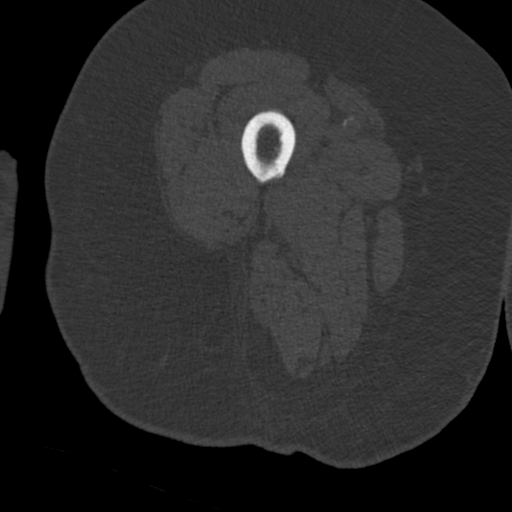
[im 52/411  soft-tissue]
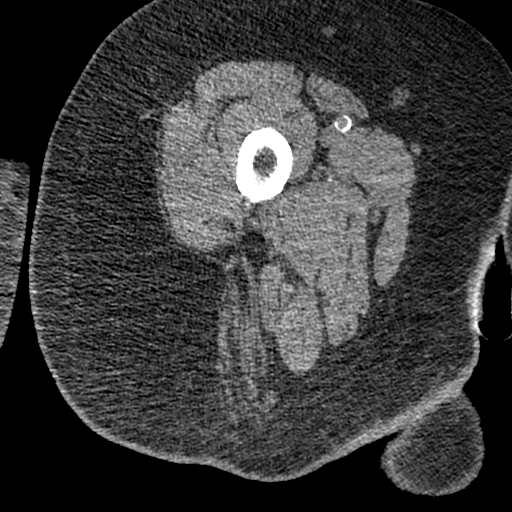
[im 86/411  soft-tissue]
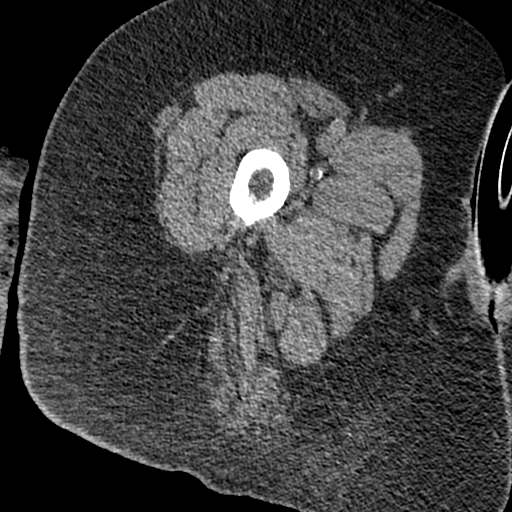
[im 103/411  soft-tissue]
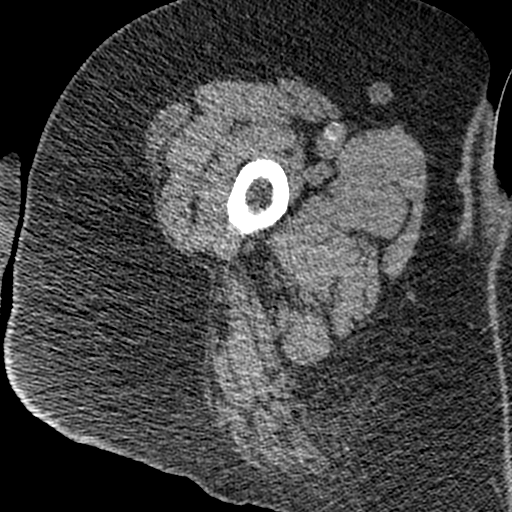
[im 137/411  soft-tissue]
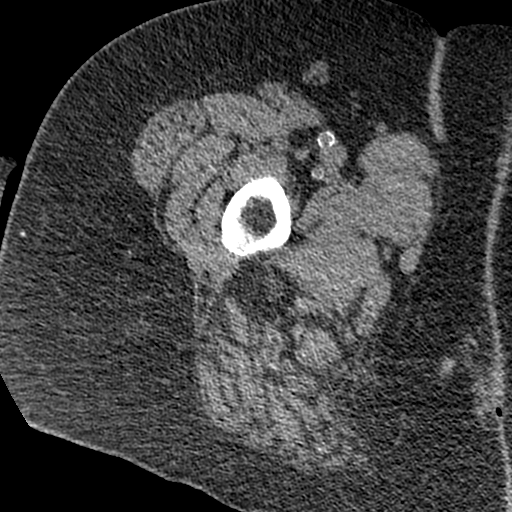
[im 171/411  soft-tissue]
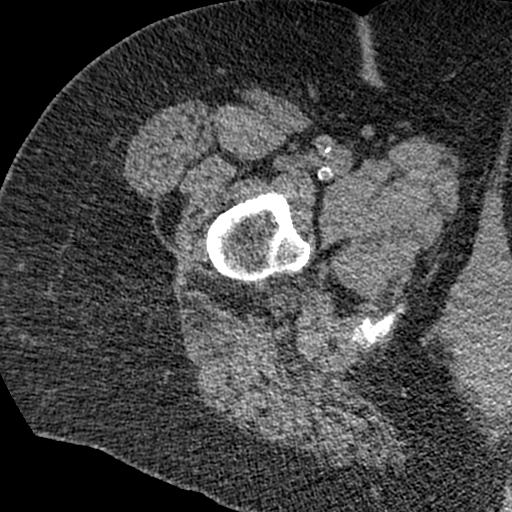
[im 188/411  soft-tissue]
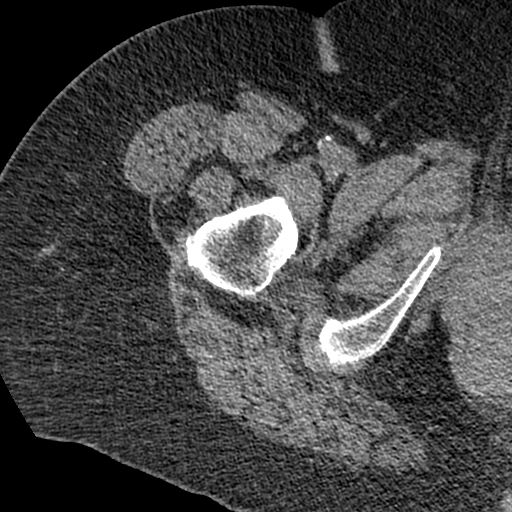
[im 223/411  soft-tissue]
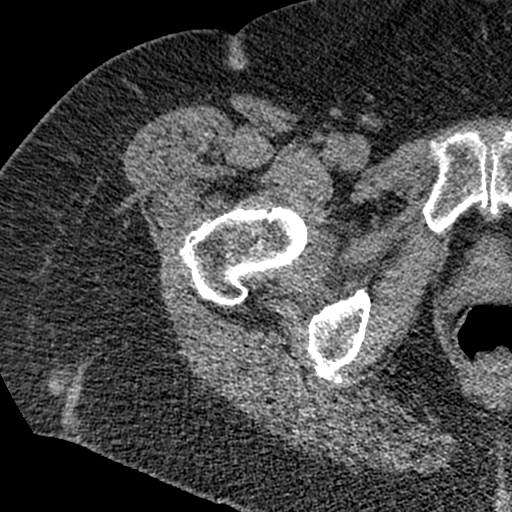
[im 240/411  soft-tissue]
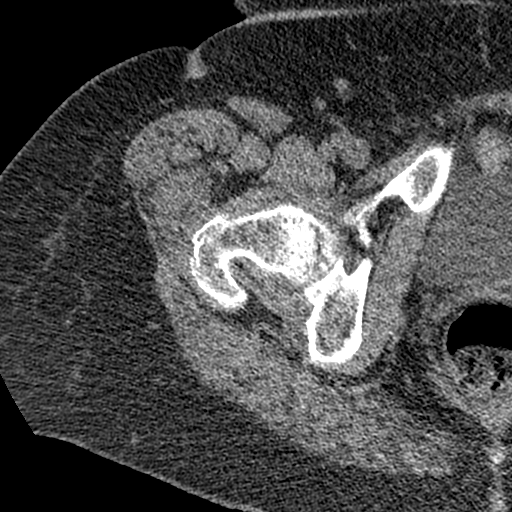
[im 240/411  bone]
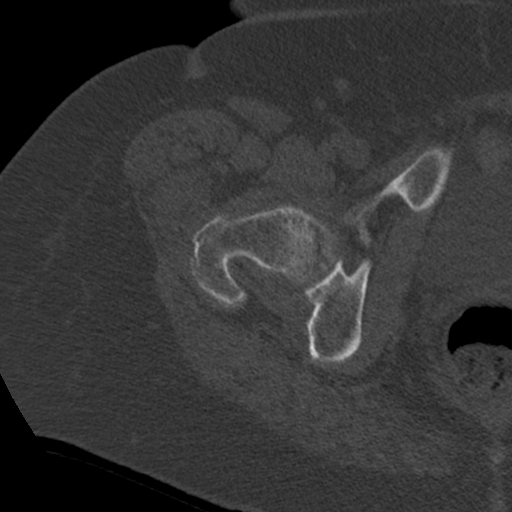
[im 274/411  soft-tissue]
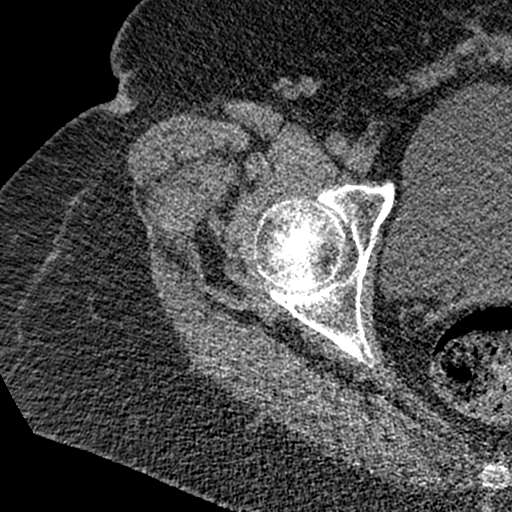
[im 308/411  soft-tissue]
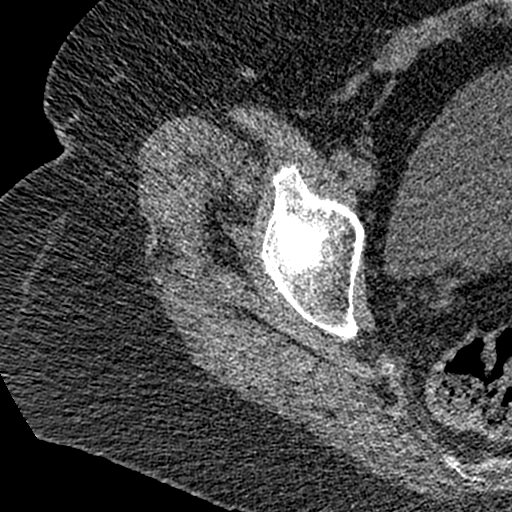
[im 325/411  soft-tissue]
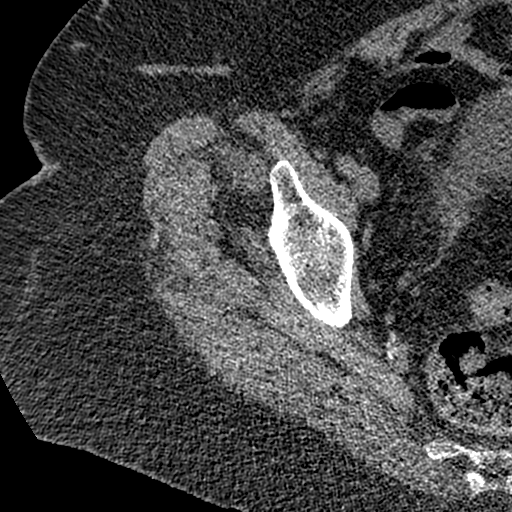
[im 359/411  soft-tissue]
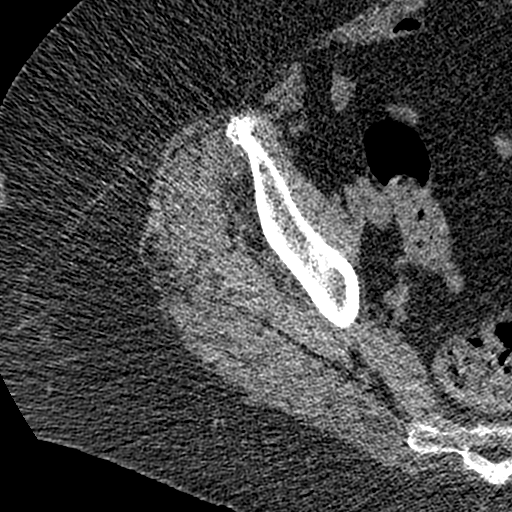
[im 393/411  soft-tissue]
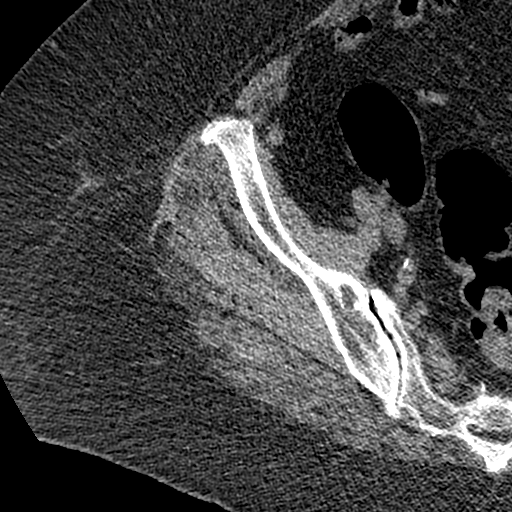

[Series 9: coronal st · coronal · 0.53mm/px · 3 of 127 slices shown]
[im 43/127  soft-tissue]
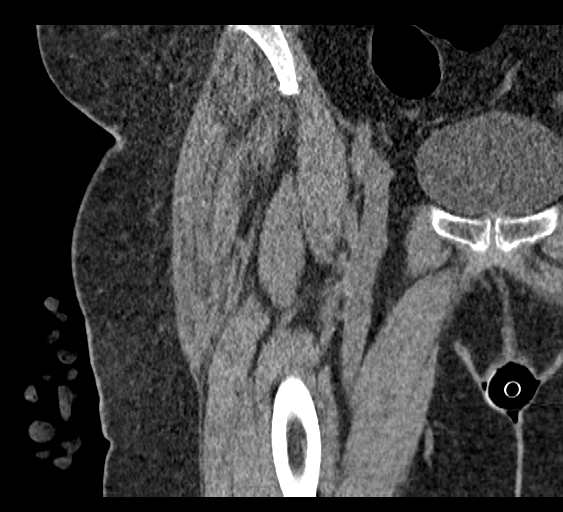
[im 57/127  soft-tissue]
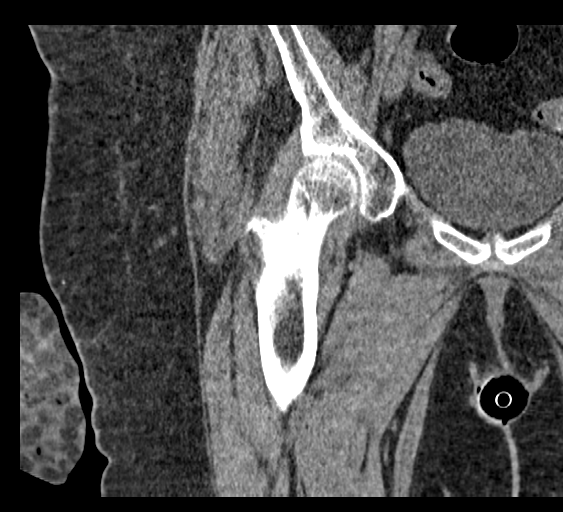
[im 71/127  soft-tissue]
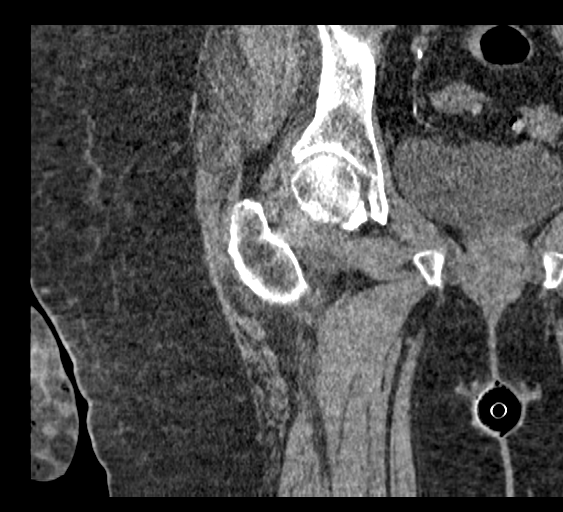

[17 of 46 positions shown; findings below may reference images not displayed]

FINDINGS: Bones/Joint/Cartilage

There is a subtle nondisplaced fracture of the right femoral neck
images 63-70). No evidence of fracture extension to the femoral head
articular surface or into the intertrochanteric region. Small right
hip joint effusion. Visualized portion of the right hemipelvis
appears intact. No additional fractures. The visualized right SI
joint and pubic symphysis are intact without diastasis.

Ligaments

Suboptimally assessed by CT.

Muscles and Tendons

No acute myotendinous abnormality by CT.

Soft tissues

No soft tissue fluid collection or hematoma. Scattered vascular
calcifications. Sigmoid diverticulosis partially visualized.
IMPRESSION: 1. Subtle nondisplaced subcapital fracture of the right femoral
neck.
2. Small right hip joint effusion.

## 2021-07-03 IMAGING — DX DG SHOULDER 2+V PORT*R*
3 series · 3 of 3 positions shown · non-contrast
Comparison: 04/13/2020

CLINICAL DATA: Fracture.  Fell yesterday.

EXAM:
PORTABLE RIGHT SHOULDER

[shoulder ap neutral]
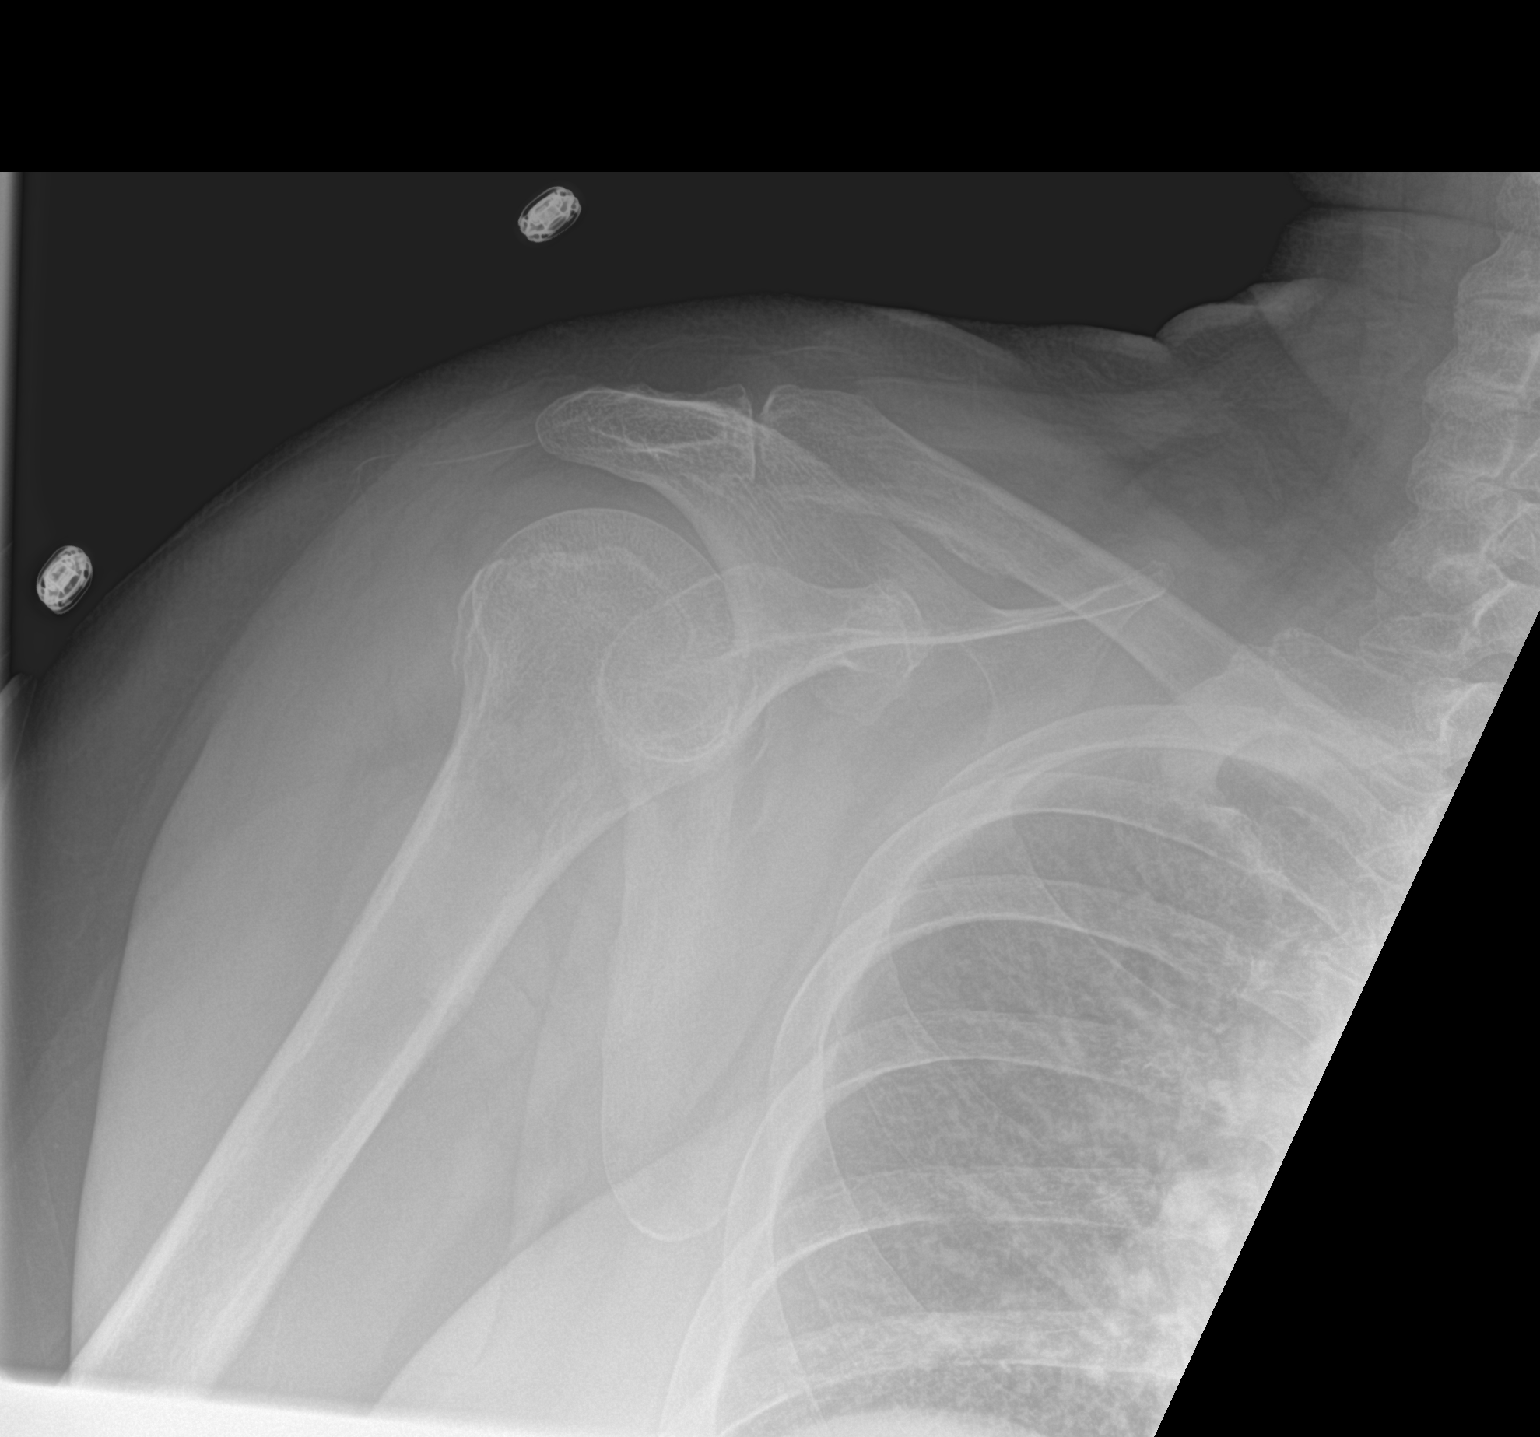

[shoulder grashey]
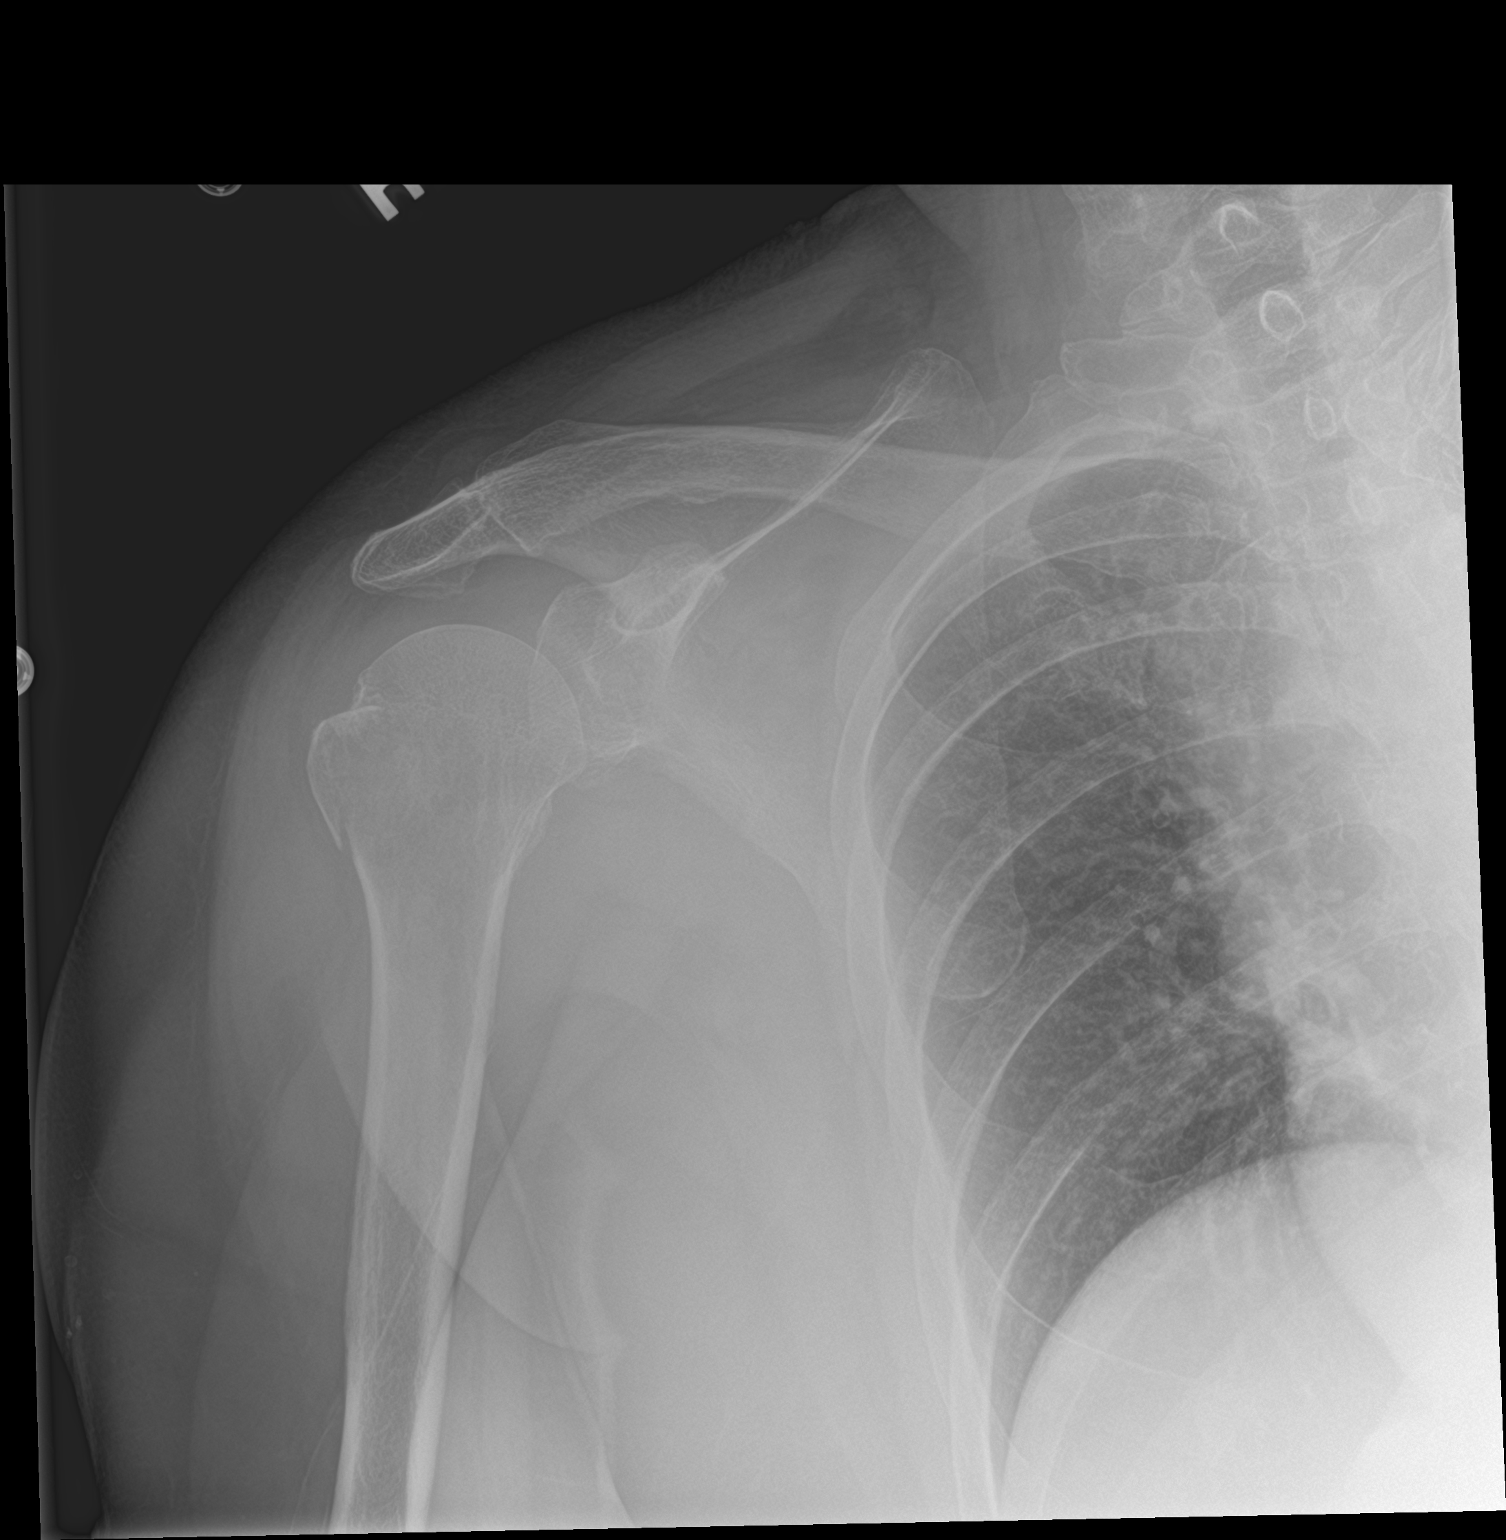

[shoulder y view]
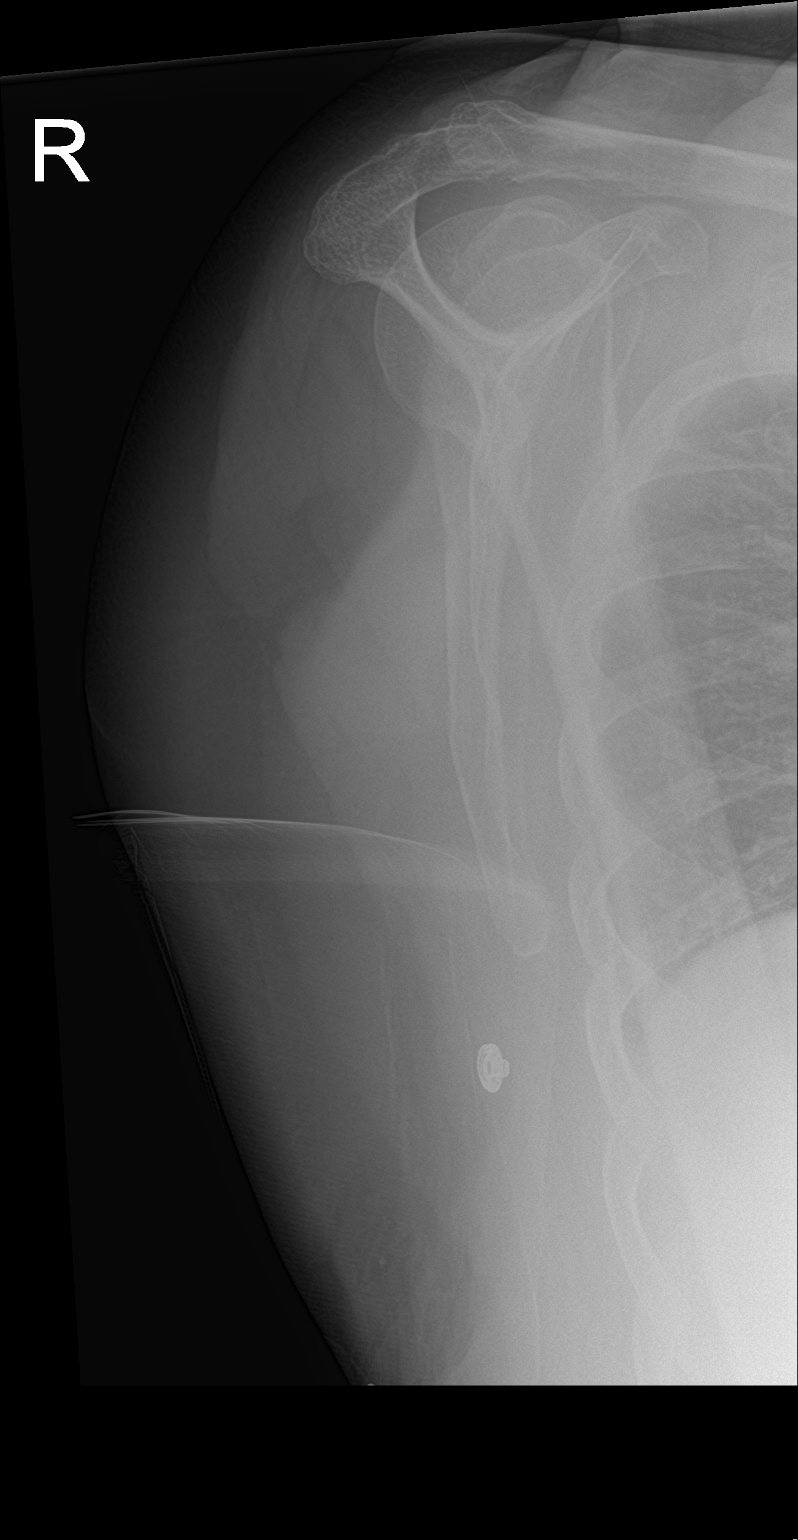

[3 of 3 positions shown; findings below may reference images not displayed]

FINDINGS: Minimally displaced fracture of the RIGHT greater tuberosity. No
dislocation or interval fracture. RIGHT lung apex is unremarkable.
IMPRESSION: Stable alignment of RIGHT greater tuberosity fracture.

## 2024-06-21 ENCOUNTER — Observation Stay (HOSPITAL_COMMUNITY)
Admission: EM | Admit: 2024-06-21 | Discharge: 2024-06-23 | Disposition: A | Discharge status: Home / Self Care | Attending: Internal Medicine | Admitting: Internal Medicine

## 2024-06-21 ENCOUNTER — Encounter (HOSPITAL_COMMUNITY): Payer: Self-pay

## 2024-06-21 ENCOUNTER — Emergency Department (HOSPITAL_COMMUNITY)

## 2024-06-21 ENCOUNTER — Other Ambulatory Visit: Payer: Self-pay

## 2024-06-21 DIAGNOSIS — Z8669 Personal history of other diseases of the nervous system and sense organs: Secondary | ICD-10-CM | POA: Diagnosis not present

## 2024-06-21 DIAGNOSIS — M797 Fibromyalgia: Secondary | ICD-10-CM | POA: Diagnosis not present

## 2024-06-21 DIAGNOSIS — R200 Anesthesia of skin: Secondary | ICD-10-CM | POA: Diagnosis not present

## 2024-06-21 DIAGNOSIS — I161 Hypertensive emergency: Secondary | ICD-10-CM

## 2024-06-21 DIAGNOSIS — I509 Heart failure, unspecified: Secondary | ICD-10-CM | POA: Insufficient documentation

## 2024-06-21 DIAGNOSIS — I2510 Atherosclerotic heart disease of native coronary artery without angina pectoris: Secondary | ICD-10-CM | POA: Diagnosis not present

## 2024-06-21 DIAGNOSIS — Z96651 Presence of right artificial knee joint: Secondary | ICD-10-CM | POA: Insufficient documentation

## 2024-06-21 DIAGNOSIS — R29700 NIHSS score 0: Secondary | ICD-10-CM | POA: Diagnosis not present

## 2024-06-21 DIAGNOSIS — G629 Polyneuropathy, unspecified: Secondary | ICD-10-CM

## 2024-06-21 DIAGNOSIS — G609 Hereditary and idiopathic neuropathy, unspecified: Secondary | ICD-10-CM | POA: Insufficient documentation

## 2024-06-21 DIAGNOSIS — E1140 Type 2 diabetes mellitus with diabetic neuropathy, unspecified: Secondary | ICD-10-CM | POA: Diagnosis not present

## 2024-06-21 DIAGNOSIS — I110 Hypertensive heart disease with heart failure: Secondary | ICD-10-CM | POA: Insufficient documentation

## 2024-06-21 DIAGNOSIS — I214 Non-ST elevation (NSTEMI) myocardial infarction: Secondary | ICD-10-CM

## 2024-06-21 DIAGNOSIS — E119 Type 2 diabetes mellitus without complications: Secondary | ICD-10-CM | POA: Insufficient documentation

## 2024-06-21 DIAGNOSIS — I6340 Cerebral infarction due to embolism of unspecified cerebral artery: Secondary | ICD-10-CM | POA: Diagnosis not present

## 2024-06-21 DIAGNOSIS — E785 Hyperlipidemia, unspecified: Secondary | ICD-10-CM | POA: Insufficient documentation

## 2024-06-21 DIAGNOSIS — I639 Cerebral infarction, unspecified: Secondary | ICD-10-CM | POA: Diagnosis not present

## 2024-06-21 DIAGNOSIS — E66812 Obesity, class 2: Secondary | ICD-10-CM | POA: Insufficient documentation

## 2024-06-21 DIAGNOSIS — I10 Essential (primary) hypertension: Secondary | ICD-10-CM | POA: Diagnosis not present

## 2024-06-21 DIAGNOSIS — Z6835 Body mass index (BMI) 35.0-35.9, adult: Secondary | ICD-10-CM | POA: Insufficient documentation

## 2024-06-21 DIAGNOSIS — Z6838 Body mass index (BMI) 38.0-38.9, adult: Secondary | ICD-10-CM | POA: Diagnosis not present

## 2024-06-21 DIAGNOSIS — R7989 Other specified abnormal findings of blood chemistry: Secondary | ICD-10-CM | POA: Insufficient documentation

## 2024-06-21 DIAGNOSIS — G43109 Migraine with aura, not intractable, without status migrainosus: Secondary | ICD-10-CM

## 2024-06-21 DIAGNOSIS — Z79899 Other long term (current) drug therapy: Secondary | ICD-10-CM | POA: Insufficient documentation

## 2024-06-21 DIAGNOSIS — R519 Headache, unspecified: Secondary | ICD-10-CM | POA: Insufficient documentation

## 2024-06-21 DIAGNOSIS — Z7902 Long term (current) use of antithrombotics/antiplatelets: Secondary | ICD-10-CM | POA: Diagnosis not present

## 2024-06-21 DIAGNOSIS — K449 Diaphragmatic hernia without obstruction or gangrene: Secondary | ICD-10-CM | POA: Insufficient documentation

## 2024-06-21 DIAGNOSIS — K219 Gastro-esophageal reflux disease without esophagitis: Secondary | ICD-10-CM

## 2024-06-21 DIAGNOSIS — G43909 Migraine, unspecified, not intractable, without status migrainosus: Secondary | ICD-10-CM | POA: Diagnosis present

## 2024-06-21 LAB — RAPID URINE DRUG SCREEN, HOSP PERFORMED
Amphetamines: NOT DETECTED
Barbiturates: NOT DETECTED
Benzodiazepines: NOT DETECTED
Cocaine: NOT DETECTED
Opiates: NOT DETECTED
Tetrahydrocannabinol: NOT DETECTED

## 2024-06-21 LAB — CBC
HCT: 44.5 % (ref 36.0–46.0)
Hemoglobin: 14.1 g/dL (ref 12.0–15.0)
MCH: 30.6 pg (ref 26.0–34.0)
MCHC: 31.7 g/dL (ref 30.0–36.0)
MCV: 96.5 fL (ref 80.0–100.0)
Platelets: 276 K/uL (ref 150–400)
RBC: 4.61 MIL/uL (ref 3.87–5.11)
RDW: 13.2 % (ref 11.5–15.5)
WBC: 8.3 K/uL (ref 4.0–10.5)
nRBC: 0 % (ref 0.0–0.2)

## 2024-06-21 LAB — LIPID PANEL
Cholesterol: 275 mg/dL — ABNORMAL HIGH (ref 0–200)
HDL: 51 mg/dL (ref >40)
LDL Cholesterol: 165 mg/dL — ABNORMAL HIGH (ref 0–99)
Total CHOL/HDL Ratio: 5.4 ratio
Triglycerides: 293 mg/dL — ABNORMAL HIGH (ref <150)
VLDL: 59 mg/dL — ABNORMAL HIGH (ref 0–40)

## 2024-06-21 LAB — COMPREHENSIVE METABOLIC PANEL WITH GFR
ALT: 17 U/L (ref 0–44)
AST: 27 U/L (ref 15–41)
Albumin: 3.9 g/dL (ref 3.5–5.0)
Alkaline Phosphatase: 90 U/L (ref 38–126)
Anion gap: 15 (ref 5–15)
BUN: 11 mg/dL (ref 8–23)
CO2: 24 mmol/L (ref 22–32)
Calcium: 9 mg/dL (ref 8.9–10.3)
Chloride: 103 mmol/L (ref 98–111)
Creatinine, Ser: 0.74 mg/dL (ref 0.44–1.00)
GFR, Estimated: >60 mL/min (ref >60)
Glucose, Bld: 97 mg/dL (ref 70–99)
Potassium: 3.8 mmol/L (ref 3.5–5.1)
Sodium: 142 mmol/L (ref 135–145)
Total Bilirubin: 0.5 mg/dL (ref 0.0–1.2)
Total Protein: 7 g/dL (ref 6.5–8.1)

## 2024-06-21 LAB — ETHANOL: Alcohol, Ethyl (B): <15 mg/dL (ref <15)

## 2024-06-21 LAB — DIFFERENTIAL
Abs Immature Granulocytes: 0.04 K/uL (ref 0.00–0.07)
Basophils Absolute: 0 K/uL (ref 0.0–0.1)
Basophils Relative: 1 %
Eosinophils Absolute: 0.1 K/uL (ref 0.0–0.5)
Eosinophils Relative: 1 %
Immature Granulocytes: 1 %
Lymphocytes Relative: 21 %
Lymphs Abs: 1.7 K/uL (ref 0.7–4.0)
Monocytes Absolute: 0.8 K/uL (ref 0.1–1.0)
Monocytes Relative: 10 %
Neutro Abs: 5.6 K/uL (ref 1.7–7.7)
Neutrophils Relative %: 66 %

## 2024-06-21 LAB — HEMOGLOBIN A1C
Hgb A1c MFr Bld: 5.5 % (ref 4.8–5.6)
Mean Plasma Glucose: 111.15 mg/dL

## 2024-06-21 LAB — TROPONIN I (HIGH SENSITIVITY)
Troponin I (High Sensitivity): 178 ng/L (ref <18)
Troponin I (High Sensitivity): 163 ng/L (ref <18)

## 2024-06-21 LAB — PROTIME-INR
INR: 1 (ref 0.8–1.2)
Prothrombin Time: 13.4 s (ref 11.4–15.2)

## 2024-06-21 LAB — CBG MONITORING, ED: Glucose-Capillary: 97 mg/dL (ref 70–99)

## 2024-06-21 LAB — APTT: aPTT: 26 s (ref 24–36)

## 2024-06-21 MED ORDER — ACETAMINOPHEN 500 MG PO TABS
1000.0000 mg | ORAL_TABLET | Freq: Once | ORAL | Status: AC
  Administered 2024-06-21: 1000 mg via ORAL
  Filled 2024-06-21: qty 2

## 2024-06-21 MED ORDER — METOCLOPRAMIDE HCL 5 MG/ML IJ SOLN
5.0000 mg | Freq: Once | INTRAMUSCULAR | Status: AC
  Administered 2024-06-21: 5 mg via INTRAVENOUS
  Filled 2024-06-21: qty 2

## 2024-06-21 MED ORDER — HYDRALAZINE HCL 20 MG/ML IJ SOLN: 10.0000 mg | INTRAMUSCULAR | Status: DC | PRN

## 2024-06-21 MED ORDER — DIPHENHYDRAMINE HCL 50 MG/ML IJ SOLN
25.0000 mg | Freq: Once | INTRAMUSCULAR | Status: AC
  Administered 2024-06-21: 25 mg via INTRAVENOUS
  Filled 2024-06-21: qty 1

## 2024-06-21 MED ORDER — LIDOCAINE 5 % EX PTCH
1.0000 | MEDICATED_PATCH | Freq: Once | CUTANEOUS | Status: AC
Start: 2024-06-21 — End: 2024-06-22
  Administered 2024-06-21: 1 via TRANSDERMAL
  Filled 2024-06-21: qty 1

## 2024-06-21 MED ORDER — ACETAMINOPHEN 650 MG RE SUPP: 650.0000 mg | RECTAL | Status: DC | PRN

## 2024-06-21 MED ORDER — IOHEXOL 350 MG/ML SOLN
75.0000 mL | Freq: Once | INTRAVENOUS | Status: AC | PRN
  Administered 2024-06-21: 75 mL via INTRAVENOUS

## 2024-06-21 MED ORDER — SODIUM CHLORIDE 0.9 % IV SOLN: INTRAVENOUS | Status: AC

## 2024-06-21 MED ORDER — GABAPENTIN 300 MG PO CAPS
300.0000 mg | ORAL_CAPSULE | Freq: Two times a day (BID) | ORAL | Status: DC
  Administered 2024-06-21 – 2024-06-23 (×4): 300 mg via ORAL
  Filled 2024-06-21 (×4): qty 1

## 2024-06-21 MED ORDER — ACETAMINOPHEN 325 MG PO TABS
650.0000 mg | ORAL_TABLET | ORAL | Status: DC | PRN
  Administered 2024-06-21 – 2024-06-23 (×2): 650 mg via ORAL
  Filled 2024-06-21 (×2): qty 2

## 2024-06-21 MED ORDER — ENOXAPARIN SODIUM 40 MG/0.4ML IJ SOSY
40.0000 mg | PREFILLED_SYRINGE | INTRAMUSCULAR | Status: DC
  Administered 2024-06-21 – 2024-06-22 (×2): 40 mg via SUBCUTANEOUS
  Filled 2024-06-21 (×2): qty 0.4

## 2024-06-21 MED ORDER — SODIUM CHLORIDE 0.9 % IV BOLUS
500.0000 mL | Freq: Once | INTRAVENOUS | Status: AC
  Administered 2024-06-21: 500 mL via INTRAVENOUS

## 2024-06-21 MED ORDER — ATORVASTATIN CALCIUM 40 MG PO TABS
40.0000 mg | ORAL_TABLET | Freq: Every day | ORAL | Status: DC
  Administered 2024-06-21 – 2024-06-23 (×3): 40 mg via ORAL
  Filled 2024-06-21 (×3): qty 1

## 2024-06-21 MED ORDER — ACETAMINOPHEN 160 MG/5ML PO SOLN: 650.0000 mg | ORAL | Status: DC | PRN

## 2024-06-21 MED ORDER — CLOPIDOGREL BISULFATE 75 MG PO TABS
75.0000 mg | ORAL_TABLET | Freq: Every day | ORAL | Status: DC
  Administered 2024-06-22 – 2024-06-23 (×2): 75 mg via ORAL
  Filled 2024-06-21 (×2): qty 1

## 2024-06-21 MED ORDER — STROKE: EARLY STAGES OF RECOVERY BOOK
Freq: Once | Status: AC
  Filled 2024-06-21: qty 1

## 2024-06-21 MED ORDER — SENNOSIDES-DOCUSATE SODIUM 8.6-50 MG PO TABS: 1.0000 | ORAL_TABLET | Freq: Every evening | ORAL | Status: DC | PRN
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
## 2024-06-21 NOTE — Plan of Care (Signed)

## 2024-06-21 NOTE — Progress Notes (Signed)
 Patient arrived to room 3W16, VS stable, patient free from pain. NIH 0, family at bedside.

## 2024-06-21 NOTE — Progress Notes (Signed)
 Patient complaining of seeing spots again. She says they have been coming and going, will continue to monitor.

## 2024-06-21 NOTE — ED Triage Notes (Signed)
 Patient bib [facility] from work with complaints of headache, hypertension, tingling in the right hand that comes and goes. EMS reports neg stroke screen.   PMH: migraines with visual spots.

## 2024-06-21 NOTE — Consult Note (Addendum)
 Cardiology Consultation   Patient ID: Kendra [name] MRN: [id number]; DOB: 1951-06-30  Admit date: 06/21/2024 Date of Consult: 06/21/2024  PCP:  Kendra Dannielle DEL, PA-C    [facility] Providers Cardiologist:  Kendra DELENA Leavens, MD       Patient Profile: Kendra [name] is a 73 y.o. female with a hx of OSA, hypercholesterolemia, hypertension, and GERD, who is being seen 06/21/2024 for the evaluation of elevated troponins at the request of Kendra Sharps MD.  History of Present Illness: Kendra [name] is a 73 year old female who per chart review has not been previously seen by cardiology.  Patient presented to the emergency department for headache, and numbness  Family was present for the interview.  On interview patient reported that she had a headache and some neck pain that started about 2 days ago.  Also reported some numbness in the fingers 4 and 5 on the right hand and some vision changes.  She does have a history of having some vision changes with her migraines.  Has a blood pressure cuff at home but has not checked her blood pressure recently.  When she checked it 3 weeks ago the blood pressure was elevated to 150's /130's. Denies any dyspnea on exertion, chest pain, shoulder pain, shortness of breath, fever, chills, nausea, vomiting, diaphoresis, melena, hematuria, and hematochezia.  Denies any alcohol use, tobacco use, cannabis use, illicit substance use.   Labs showed negative urine drug screen, elevated high-sensitivity troponins 163 > 178, potassium 3.8, creatinine 0.74, calcium 9.0, WBC count 8.3, and hemoglobin 14.1. Blood pressure has been significantly elevated. At 5pm was 190/80.   Brain MRI showed Small acute supratentorial and infratentorial infarcts suggestive of emboli. Mild chronic small vessel ischemic disease.  Chest x-ray showed no acute cardiopulmonary process.  EKG showed normal sinus rhythm with a rate of 77  Past Medical History:  Diagnosis  Date   Arthritis    Complication of anesthesia    Family history of adverse reaction to anesthesia    Fibromyalgia    GERD (gastroesophageal reflux disease)    Hypertension    PONV (postoperative nausea and vomiting)    Sleep apnea    uses mouth guard    Past Surgical History:  Procedure Laterality Date   ABDOMINAL HYSTERECTOMY  2001   CARTILAGE SURGERY     CESAREAN SECTION  1967   CHOLECYSTECTOMY  2005   ESOPHAGOGASTRODUODENOSCOPY (EGD) WITH PROPOFOL  N/A 08/16/2020   Procedure: ESOPHAGOGASTRODUODENOSCOPY (EGD) WITH PROPOFOL ;  Surgeon: Kendra Gwendlyn DASEN, MD;  Location: [facility] ENDOSCOPY;  Service: Endoscopy;  Laterality: N/A;   FOREIGN BODY REMOVAL N/A 08/16/2020   Procedure: FOREIGN BODY REMOVAL;  Surgeon: Kendra Gwendlyn DASEN, MD;  Location: [facility] ENDOSCOPY;  Service: Endoscopy;  Laterality: N/A;   HIP PINNING,CANNULATED Right 04/14/2020   Procedure: CANNULATED HIP PINNING;  Surgeon: Kendra Kay HERO, MD;  Location: [facility] OR;  Service: Orthopedics;  Laterality: Right;   MENISCUS REPAIR     TOTAL KNEE ARTHROPLASTY Right 09/12/2018   Procedure: TOTAL KNEE ARTHROPLASTY;  Surgeon: Kendra Kemps, MD;  Location: [facility] ORS;  Service: Orthopedics;  Laterality: Right;   TUBAL LIGATION  1984   WRIST SURGERY Right    Torn Ligament     Home Medications:  Prior to Admission medications   Medication Sig Start Date End Date Taking? Authorizing Provider  amLODipine (NORVASC) 5 MG tablet Take 5 mg by mouth at bedtime. 02/03/20   [provider]  ascorbic acid (VITAMIN C) 500 MG tablet Take 500 mg  by mouth in the morning.    [provider]  atorvastatin (LIPITOR) 20 MG tablet Take 20 mg by mouth at bedtime. 03/26/20   [provider]  BACLOFEN PO Take 1 tablet by mouth 2 (two) times daily as needed (for muscle spasms).    [provider]  budesonide-formoterol (SYMBICORT) 160-4.5 MCG/ACT inhaler Inhale 2 puffs into the lungs 2 (two) times daily as needed (for flares).  11/28/19    [provider]  Cholecalciferol (VITAMIN D3) 50 MCG (2000 UT) TABS Take 4,000 Units by mouth daily.    [provider]  Cyanocobalamin (VITAMIN B-12) 500 MCG SUBL Place 500 mcg under the tongue daily.    [provider]  ELDERBERRY PO Take 1 capsule by mouth daily.    [provider]  enoxaparin  (LOVENOX ) 40 MG/0.4ML injection Inject 0.4 mLs (40 mg total) into the skin daily. 04/14/20   Kendra Kay HERO, MD  Ferrous Sulfate  (SLOW FE PO) Take 1 tablet by mouth daily with breakfast.    [provider]  fluticasone (FLONASE) 50 MCG/ACT nasal spray Place 1 spray into both nostrils 2 (two) times daily as needed for allergies or rhinitis.  02/15/20   [provider]  gabapentin  (NEURONTIN ) 400 MG capsule Take 400 mg by mouth 3 (three) times daily.    [provider]  KRILL OIL PO Take 1 capsule by mouth daily.    [provider]  loratadine (CLARITIN) 10 MG tablet Take 10 mg by mouth in the morning.    [provider]  montelukast (SINGULAIR) 10 MG tablet Take 10 mg by mouth at bedtime. 03/26/20   [provider]  Multiple Vitamins-Minerals (ONE-A-DAY WOMENS PO) Take 1 tablet by mouth daily with breakfast.    [provider]  OVER THE COUNTER MEDICATION Take 1 tablet by mouth See admin instructions. Calcium/Zinc tablet- Take 1 tablet by mouth once a day    [provider]  oxyCODONE -acetaminophen  (PERCOCET) 5-325 MG tablet Take 1-2 tablets by mouth every 8 (eight) hours as needed for severe pain. 04/14/20   Kendra Kay HERO, MD  pantoprazole  (PROTONIX ) 40 MG tablet Take 40 mg by mouth 2 (two) times daily.    [provider]  valsartan (DIOVAN) 160 MG tablet Take 160 mg by mouth in the morning. 04/04/20   [provider]    Scheduled Meds:  [START ON 06/22/2024]  stroke: early stages of recovery book   Does not apply Once   enoxaparin  (LOVENOX ) injection  40 mg Subcutaneous Q24H    gabapentin   300 mg Oral BID   lidocaine   1 patch Transdermal Once   Continuous Infusions:  sodium chloride      PRN Meds: acetaminophen  **OR** acetaminophen  (TYLENOL ) oral liquid 160 mg/5 mL **OR** acetaminophen , hydrALAZINE, senna-docusate  Allergies:    Allergies  Allergen Reactions   Ibuprofen Other (See Comments)    Concerns w/ Stomach bleeds   Nsaids Other (See Comments)    GI Issues   Other Nausea And Vomiting    General anesthesia- CAN TOLERATE ONLY PROPOFOL    Codeine Nausea And Vomiting        Hydrocodone-Acetaminophen  Nausea And Vomiting   Hydromorphone  Itching   Morphine  And Codeine Nausea And Vomiting and Other (See Comments)    Causes N/V after an extended period of time   Oxycodone  Other (See Comments)    Can tolerate a very low dose (with ginger ale and dry toast)   Propoxyphene Nausea And Vomiting   Tizanidine  Other (See Comments)    made me feel weird   Tramadol Hcl Nausea And Vomiting and Other (See Comments)    Face got hot and vertigo- with 25 mg taken (too)   Prednisone Rash    Social History:   Social History   Socioeconomic History   Marital status: Widowed    Spouse name: Not on file   Number of children: 2   Years of education: 14   Highest education level: Bachelor's degree (e.g., BA, AB, BS)  Occupational History   Occupation: retired  Tobacco Use   Smoking status: Never   Smokeless tobacco: Never  Vaping Use   Vaping status: Never Used  Substance and Sexual Activity   Alcohol use: Never   Drug use: Never   Sexual activity: Not Currently  Other Topics Concern   Not on file  Social History Narrative   Not on file   Social Drivers of Health   Financial Resource Strain: Not on file  Food Insecurity: No Food Insecurity (06/21/2024)   Hunger Vital Sign    Worried About Running Out of Food in the Last Year: Never true    Ran Out of Food in the Last Year: Never true  Transportation Needs: No Transportation Needs (06/21/2024)    PRAPARE - Administrator, Civil Service (Medical): No    Lack of Transportation (Non-Medical): No  Physical Activity: Not on file  Stress: Not on file  Social Connections: Moderately Integrated (06/21/2024)   Social Connection and Isolation Panel    Frequency of Communication with Friends and Family: More than three times a week    Frequency of Social Gatherings with Friends and Family: More than three times a week    Attends Religious Services: More than 4 times per year    Active Member of [employer] or Organizations: Yes    Attends Banker Meetings: More than 4 times per year    Marital Status: Widowed  Intimate Partner Violence: Not At Risk (06/21/2024)   Humiliation, Afraid, Rape, and Kick questionnaire    Fear of Current or Ex-Partner: No    Emotionally Abused: No    Physically Abused: No    Sexually Abused: No    Family History:   History reviewed. No pertinent family history.   ROS:  Please see the history of present illness.   All other ROS reviewed and negative.     Physical Exam/Data: Vitals:   06/21/24 1030 06/21/24 1431 06/21/24 1432 06/21/24 1648  BP: (!) 181/85  (!) 150/68 (!) 157/75  Pulse: 82  73 83  Resp: 16  15   Temp: 97.9 F (36.6 C) 97.9 F (36.6 C)  97.9 F (36.6 C)  TempSrc:  Oral  Oral  SpO2: 97%   100%  Weight:      Height:       No intake or output data in the 24 hours ending 06/21/24 1649    06/21/2024   10:28 AM 10/08/2020   11:05 AM 08/16/2020    3:03 PM  Last 3 Weights  Weight (lbs) 230 lb 230 lb 230 lb  Weight (kg) 104.327 kg 104.327 kg 104.327 kg     Body mass index is 38.27 kg/m.  General:  Well nourished, well developed, in no acute distress.  Alert and orientated on room air HEENT: normal Neck: no JVD Vascular: No carotid bruits; Distal pulses 2+ bilaterally Cardiac:  normal S1, S2; RRR; no murmur  Lungs:  clear to auscultation bilaterally,  no wheezing, rhonchi or rales  Abd: soft, nontender, no  hepatomegaly  Ext: no edema Musculoskeletal:  No deformities. Skin: warm and dry  Neuro:   no focal abnormalities noted Psych:  Normal affect   EKG:  The EKG was personally reviewed and demonstrates:  normal sinus rhythm with a rate of 77. borderline QT prolongation. Telemetry:  Telemetry was personally reviewed and demonstrates: Normal sinus rhythm with heart rates in the 70s to 80  Relevant CV Studies: Echo pending  Laboratory Data: High Sensitivity Troponin:   Recent Labs  Lab 06/21/24 1049 06/21/24 1359  TROPONINIHS 178* 163*     Chemistry Recent Labs  Lab 06/21/24 1049  NA 142  K 3.8  CL 103  CO2 24  GLUCOSE 97  BUN 11  CREATININE 0.74  CALCIUM 9.0  GFRNONAA >60  ANIONGAP 15    Recent Labs  Lab 06/21/24 1049  PROT 7.0  ALBUMIN 3.9  AST 27  ALT 17  ALKPHOS 90  BILITOT 0.5   Lipids No results for input(s): CHOL, TRIG, HDL, LABVLDL, LDLCALC, CHOLHDL in the last 168 hours.  Hematology Recent Labs  Lab 06/21/24 1049  WBC 8.3  RBC 4.61  HGB 14.1  HCT 44.5  MCV 96.5  MCH 30.6  MCHC 31.7  RDW 13.2  PLT 276   Thyroid No results for input(s): TSH, FREET4 in the last 168 hours.  BNPNo results for input(s): BNP, PROBNP in the last 168 hours.  DDimer No results for input(s): DDIMER in the last 168 hours.  Radiology/Studies:  CT C-SPINE NO CHARGE Result Date: 06/21/2024 CLINICAL DATA:  Neuro deficit, acute, stroke suspected. Numbness in the fourth and fifth digits of the right hand. EXAM: CT CERVICAL SPINE WITH CONTRAST TECHNIQUE: Multiplanar CT images of the cervical spine were reconstructed from contemporary CTA of the Neck. RADIATION DOSE REDUCTION: This exam was performed according to the departmental dose-optimization program which includes automated exposure control, adjustment of the mA and/or kV according to patient size and/or use of iterative reconstruction technique. CONTRAST:  No additional. COMPARISON:  None Available.  FINDINGS: Alignment: Reversal of the normal cervical lordosis. Trace anterolisthesis of C7 on T1. Skull base and vertebrae: No acute fracture or suspicious lesion. Soft tissues and spinal canal: No prevertebral fluid or swelling. No visible canal hematoma. Disc levels: Moderate disc space narrowing and degenerative endplate changes at C4-5, C5-6, and C7-T1. Asymmetrically advanced facet arthrosis on the left at C2-3 and on the right at C3-4. Mild spinal stenosis at C4-5 and C5-6. Moderate left neural foraminal stenosis at C5-6 and no more than mild apparent neural foraminal stenosis elsewhere. No evidence of high-grade right-sided neural foraminal stenosis in the lower cervical spine to correlate with the patient's right hand numbness. Upper chest: No mass or consolidation in the included lung apices. Other: CTA reported separately. IMPRESSION: 1. No acute osseous abnormality. 2. Cervical disc and facet degeneration with mild spinal stenosis and moderate left neural foraminal stenosis at C5-6. Electronically Signed   By: Dasie Hamburg M.D.   On: 06/21/2024 16:40   CT ANGIO HEAD NECK W WO CM Result Date: 06/21/2024 CLINICAL DATA:  Neuro deficit, acute, stroke suspected. Right hand tingling, headache, and hypertension. EXAM: CT ANGIOGRAPHY HEAD AND NECK WITH AND WITHOUT CONTRAST TECHNIQUE: Multidetector CT imaging of the head and neck was performed using the standard protocol during bolus administration of intravenous contrast. Multiplanar CT image reconstructions and MIPs were obtained to evaluate the vascular anatomy. Carotid stenosis measurements (when applicable) are obtained  utilizing NASCET criteria, using the distal internal carotid diameter as the denominator. RADIATION DOSE REDUCTION: This exam was performed according to the departmental dose-optimization program which includes automated exposure control, adjustment of the mA and/or kV according to patient size and/or use of iterative reconstruction  technique. CONTRAST:  75mL OMNIPAQUE IOHEXOL 350 MG/ML SOLN COMPARISON:  None Available. FINDINGS: CTA NECK FINDINGS Aortic arch: Standard branching with moderate atherosclerotic calcification. Patent brachiocephalic and subclavian arteries without evidence of a significant stenosis. Right carotid system: Patent with a small amount of scattered plaque in the common carotid artery and a moderate amount of calcified plaque in the carotid bulb. No evidence of a significant stenosis or dissection. Left carotid system: Patent with mild scattered atherosclerotic plaque in the common carotid artery and at the carotid bifurcation. No evidence of a significant stenosis or dissection. Vertebral arteries: Patent and codominant with calcified plaque at both vertebral artery origins resulting in at most mild stenosis. No evidence of a dissection. Skeleton: Cervical spine reported separately. No destructive process. Other neck: No evidence of cervical lymphadenopathy or mass. Upper chest: No mass or consolidation in the included lung apices. Review of the MIP images confirms the above findings CTA HEAD FINDINGS Anterior circulation: The internal carotid arteries are patent from skull base to carotid termini with mild atherosclerosis not resulting in significant stenosis. ACAs and MCAs are patent without evidence of a proximal branch occlusion or significant proximal stenosis. No aneurysm is identified. Posterior circulation: The intracranial vertebral arteries are patent with calcified plaque resulting in mild stenosis bilaterally. Patent PICA, AICA, and SCA origins are visualized bilaterally. The basilar artery is widely patent. Posterior communicating arteries are diminutive or absent. Both PCAs are patent without evidence of a significant proximal stenosis. No aneurysm is identified. Venous sinuses: Patent. Anatomic variants: None. Review of the MIP images confirms the above findings IMPRESSION: 1. Mild atherosclerosis in the  head and neck without a large vessel occlusion or flow limiting proximal stenosis. 2.  Aortic Atherosclerosis (ICD10-I70.0). Electronically Signed   By: Dasie Hamburg M.D.   On: 06/21/2024 16:10   DG Chest 1 View Result Date: 06/21/2024 EXAM: 1 VIEW(S) XRAY OF THE CHEST 06/21/2024 02:46:00 PM COMPARISON: Comparison 04/13/2020 stable hiatal hernia. CLINICAL HISTORY: Nstemi. Nstemi, HA x Monday, tingling in right fingers, HTN, nonsmoker. FINDINGS: LUNGS AND PLEURA: No focal pulmonary opacity. No pulmonary edema. No pleural effusion. No pneumothorax. HEART AND MEDIASTINUM: No acute abnormality of the cardiac and mediastinal silhouettes. Stable hiatal hernia. BONES AND SOFT TISSUES: No acute osseous abnormality. IMPRESSION: 1. No acute cardiopulmonary process 2. Stable hiatal hernia Electronically signed by: Lynwood Seip MD 06/21/2024 02:55 PM EDT RP Workstation: [id number]   MR BRAIN WO CONTRAST Result Date: 06/21/2024 CLINICAL DATA:  Right hand numbness, headache, and hypertension. EXAM: MRI HEAD WITHOUT CONTRAST TECHNIQUE: Multiplanar, multiecho pulse sequences of the brain and surrounding structures were obtained without intravenous contrast. COMPARISON:  Head CT 06/21/2024 FINDINGS: The study is intermittently up to moderately motion degraded. Brain: There are punctate acute infarcts in the right middle cerebellar peduncle and more inferiorly in the right cerebellar hemisphere. Small acute supratentorial infarcts involve the splenium of the corpus callosum on the left and left parietal and posterior left frontal cortex and subcortical white matter. The largest infarct is that in the corpus callosum and measures 7 mm. No intracranial hemorrhage, mass, midline shift, or extra-axial fluid collection is identified. There is mild-to-moderate cerebral atrophy. T2 hyperintensities in the cerebral white matter and pons are nonspecific but compatible  with mild chronic small vessel ischemic disease. 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 Vascular: Major intracranial vascular flow voids are preserved. Skull and upper cervical spine: Unremarkable bone marrow signal. Sinuses/Orbits: Unremarkable orbits. Paranasal sinuses and mastoid air cells are clear. Other: None. IMPRESSION: 1. Small acute supratentorial and infratentorial infarcts suggestive of emboli. 2. Mild chronic small vessel ischemic disease. Electronically Signed   By: Dasie Hamburg M.D.   On: 06/21/2024 13:21   CT HEAD WO CONTRAST Result Date: 06/21/2024 EXAM: CT HEAD WITHOUT CONTRAST 06/21/2024 10:59:34 AM TECHNIQUE: CT of the head was performed without the administration of intravenous contrast. Automated exposure control, iterative reconstruction, and/or weight based adjustment of the mA/kV was utilized to reduce the radiation dose to as low as reasonably achievable. COMPARISON: None available. CLINICAL HISTORY: Neuro deficit, acute, stroke suspected. Patient bib [facility] from work with complaints of headache, hypertension, tingling in the right hand that comes and goes. EMS reports neg stroke screen. PMH: migraines with visual spots. FINDINGS: BRAIN AND VENTRICLES: No acute hemorrhage. No evidence of acute infarct. Nonspecific hypoattenuation in the periventricular and subcortical white matter, most likely representing chronic microvascular ischemic changes. Mild generalized parenchymal volume loss. There is a remote infarct in the periventricular white matter adjacent to the right frontal horn partially involving the genu of the corpus callosum. Small remote lacunar infarct in the left thalamus. Mineralization in the basal ganglia and bilateral cerebellum. No hydrocephalus. No extra-axial collection. No mass effect or midline shift. ORBITS: No acute abnormality. SINUSES: No acute abnormality. SOFT TISSUES AND SKULL: No acute soft tissue abnormality. No skull fracture. VASCULATURE: Atherosclerosis of the  carotid siphons and intracranial vertebral arteries. IMPRESSION: 1. No acute intracranial abnormality. 2. 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 3. Chronic microvascular ischemic changes and mild volume loss. Electronically signed by: Donnice Mania MD 06/21/2024 11:14 AM EDT RP Workstation: [id number]     Assessment and Plan:  MEGA KINKADE is a 73 y.o. female with a hx of OSA, hypercholesterolemia, hypertension, and GERD, who is being seen 06/21/2024 for the evaluation of elevated troponins at the request of Kendra Sharps MD.  CVA Patient presented to the emergency department for headache, and numbness. Brain MRI showed Small acute supratentorial and infratentorial infarcts suggestive of emboli. Mild chronic small vessel ischemic disease. Management per neurology.   Hypertensive urgency Elevated high-sensitivity troponin 163 > 178 Denies any chest pain, dyspnea on exertion, or shortness of breath. Has a diagnosis of hypertension.  Suspect that this is poorly controlled. Has a blood pressure cuff at home but has not checked her blood pressure recently.  When she checked it 3 weeks ago the blood pressure was elevated to 150's /130's.  In the emergency department the patient has significant the elevated blood pressure. At 5pm was 190/80.  Suspect that elevated and downtrending troponins are likely secondary to demand ischemia from hypertensive urgency After neurology has seen if patient is not in the permissive hypertension would recommend starting medications for controlling blood pressure. Echo pending Lipid panel pending Restart atorvastatin 20 mg daily   OSA Recommend ordering CPAP.    Risk Assessment/Risk Scores:       For questions or updates, please contact [facility] Please consult [email or web address] for contact info under     Signed, Morse Clause, PA-C  06/21/2024 4:49 PM   I have personally evaluated and examined the  patient. The history, physical  exam, and medical decision making documented below were performed independently and substantively by me. I have reviewed all relevant data, formulated the assessment and plan, and assumed responsibility for the management of this patient. My documentation reflects the substantive portion of the split/shared visit, in accordance with CMS and CPT guidelines.  I have personally performed the substantive portion of the medical decision making, including interpretation of diagnostic data, formulation of the management plan, and assessment of risks. In summation:  Kendra [name] is 73 year old with hypertension who presents with stroke-like symptoms and elevated troponin levels. Her family is present at bedside and provides support. Dr. Claudene consulted us  for evaluation of troponin elevation.  She presented with stroke-like symptoms, including visual changes and headache. Imaging revealed no acute infarct but identified small acute supratentorial and infratentorial infarcts. She was found to have a hypertensive emergency at that time.  Her troponin levels were initially elevated at 178 and have since decreased to 163. A CT angiogram of the head and neck showed aortic atherosclerosis and minimal carotid plaques. An EKG showed sinus rhythm with significant artifact, biatrial enlargement, and T wave inversions in AVL.  She has a history of hypertension, for which she takes valsartan and amlodipine, although she does not routinely take her blood pressure medications. She also has a history of fibromyalgia and arthritis, which cause chronic pain that she is able to tolerate. She does not take atorvastatin for her cholesterol as prescribed, noting that she lets medications expire or run out rather than having issues with them.  She has a history of many drug allergies, with over twelve listed in her medical record. However, she reports no allergies to her current medications for blood  pressure or cholesterol.  She previously worked in [employer] and frequently donates blood, noting her blood pressure is typically around 150 systolic, with her normal ambient blood pressure closer to the 140s.  Exam notable for  Gen: no distress, morbid obesity   Neck: No JVD, no carotid bruit Cardiac: No Rubs or Gallops, no Murmur, RRR +2 radial pulses Respiratory: Clear to auscultation bilaterally, normal effort, normal  respiratory rate GI: Soft, nontender, non-distended  MS: No  edema; warm moves all extremities Integument: Skin feels  Neuro:  At time of evaluation, alert and oriented to person/place/time/situation ok Psych: Normal affect, patient feels ok  Personally reviewed data and interpretation:   Labs notable for  High sensitivity troponin: 178 ->163  RADIOLOGY CT angiography head and neck: Aortic atherosclerosis, minimal carotid plaques Personally reviewed  DIAGNOSTIC EKG: Sinus rhythm with significant artifact, biatrial enlargement, T wave inversions in aVL Tele: SR  In assessment and plan:   Non-ST elevation myocardial infarction (NSTEMI) likely secondary to hypertensive emergency and acute ischemic stroke - NSTEMI likely secondary to demand ischemia in the context of hypertensive emergency and acute cerebrovascular accident (CVA). Troponin levels have down-trended from 178 to 163. Neurology is involved in her care, managing her blood pressure and initiating dual antiplatelet therapy. An echocardiogram is pending to further evaluate cardiac function. - Continue dual antiplatelet therapy as per neurology recommendations - Await echocardiogram results - at this time planned for outpatient ischemic eval only  Hypertensive emergency and essential hypertension Hypertensive emergency identified in the context of acute ischemic stroke. Blood pressure management is being coordinated by the neurology team with permissive hypertension strategy. She is non-compliant  with her prescribed antihypertensive medications, including valsartan and amlodipine due to letting them expire - likely restart ARB when able  Aortic atherosclerosis and minimal carotid artery atherosclerosis - CT angiography of the head and neck reveals aortic atherosclerosis and minimal carotid plaques. These findings are significant for vascular disease, which may contribute to her cardiovascular risk profile. - LDL goal < 70; labs are pending  Kendra Leavens, MD FASE [facility] Cardiologist [facility]  [address], [address] [place], [place] [address] [phone]  6:17 PM

## 2024-06-21 NOTE — ED Notes (Signed)
 NT to get EKG.

## 2024-06-21 NOTE — ED Notes (Signed)
 Contacted CCMD to have pt moved from 27 to hallway 18.

## 2024-06-21 NOTE — ED Provider Notes (Signed)
 Kendra [name] EMERGENCY DEPARTMENT AT [facility] Provider Note  CSN: [id number] Arrival date & time: 06/21/24 1023  Chief Complaint(s) headcahe, Hypertension, and Numbness  HPI Kendra [name] is a 73 y.o. female with past medical history as below, significant for fibromyalgia, GERD, hypertension, esophageal dysphagia who presents to the ED with complaint of headache, hypertension, numbness to right 4th and 5th digit.  Onset of symptoms around 9:00 this morning while patient was performing her work duties.  She began having right sided occipital headache today with right spots to her right eye.  Typically her migraine she has very little pain but does have the visual changes that she is exhibiting this morning.  She also noticed her right 4th and 5th digit were numb, no numbness noted proximal to the wrist.  The numbness resolved after around 30 minutes or so.  The bright spots in her vision also resolved but she is having persistent right sided headache posterior.  Reports she took some Tylenol  earlier which did provide some relief of her headache, headache seems to be returning.  No falls, no nausea or vomiting, no numbness or weakness otherwise.  No diplopia.  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 Authorizing Provider  amLODipine (NORVASC) 5 MG tablet Take 5 mg by mouth at bedtime. 02/03/20   [provider]  ascorbic  acid (VITAMIN C) 500 MG tablet Take 500 mg by mouth in the morning.    [provider]  atorvastatin (LIPITOR) 20 MG tablet Take 20 mg by mouth at bedtime. 03/26/20   [provider]  BACLOFEN PO Take 1 tablet by mouth 2 (two) times daily as needed (for muscle spasms).    [provider]  budesonide-formoterol (SYMBICORT) 160-4.5 MCG/ACT inhaler Inhale 2 puffs into the lungs 2 (two) times daily as needed (for flares).  11/28/19   [provider]  Cholecalciferol (VITAMIN D3) 50 MCG (2000 UT) TABS Take 4,000 Units by mouth daily.    [provider]  Cyanocobalamin (VITAMIN B-12) 500 MCG SUBL Place 500 mcg under the tongue daily.    [provider]  ELDERBERRY PO Take 1 capsule by mouth daily.    [provider]  enoxaparin  (LOVENOX ) 40 MG/0.4ML injection Inject 0.4 mLs (40 mg total) into the skin daily. 04/14/20   Jerri Kay HERO, MD  Ferrous Sulfate  (SLOW FE PO) Take 1 tablet by mouth daily with breakfast.    [provider]  fluticasone (FLONASE) 50 MCG/ACT nasal spray Place 1 spray into both nostrils 2 (two) times daily as needed for allergies or rhinitis.  02/15/20   [provider]  gabapentin  (NEURONTIN ) 400 MG capsule Take 400 mg by mouth 3 (three) times daily.    [provider]  KRILL OIL PO Take 1 capsule by mouth daily.    [provider]  loratadine (CLARITIN) 10 MG tablet  Take 10 mg by mouth in the morning.    [provider]  montelukast (SINGULAIR) 10 MG tablet Take 10 mg by mouth at bedtime. 03/26/20   [provider]  Multiple Vitamins-Minerals (ONE-A-DAY WOMENS PO) Take 1 tablet by mouth daily with breakfast.    [provider]  OVER THE COUNTER MEDICATION Take 1 tablet by mouth See admin instructions. Calcium/Zinc tablet- Take 1 tablet by mouth once a day    [provider]  oxyCODONE -acetaminophen  (PERCOCET) 5-325 MG tablet Take 1-2 tablets by mouth every 8  (eight) hours as needed for severe pain. 04/14/20   Jerri Kay HERO, MD  pantoprazole  (PROTONIX ) 40 MG tablet Take 40 mg by mouth 2 (two) times daily.    [provider]  valsartan (DIOVAN) 160 MG tablet Take 160 mg by mouth in the morning. 04/04/20   [provider]                                                                                                                                    Past Surgical History Past Surgical History:  Procedure Laterality Date   ABDOMINAL HYSTERECTOMY  2001   CARTILAGE SURGERY     CESAREAN SECTION  1967   CHOLECYSTECTOMY  2005   ESOPHAGOGASTRODUODENOSCOPY (EGD) WITH PROPOFOL  N/A 08/16/2020   Procedure: ESOPHAGOGASTRODUODENOSCOPY (EGD) WITH PROPOFOL ;  Surgeon: Aneita Gwendlyn DASEN, MD;  Location: [facility] ENDOSCOPY;  Service: Endoscopy;  Laterality: N/A;   FOREIGN BODY REMOVAL N/A 08/16/2020   Procedure: FOREIGN BODY REMOVAL;  Surgeon: Aneita Gwendlyn DASEN, MD;  Location: [facility] ENDOSCOPY;  Service: Endoscopy;  Laterality: N/A;   HIP PINNING,CANNULATED Right 04/14/2020   Procedure: CANNULATED HIP PINNING;  Surgeon: Jerri Kay HERO, MD;  Location: [facility] OR;  Service: Orthopedics;  Laterality: Right;   MENISCUS REPAIR     TOTAL KNEE ARTHROPLASTY Right 09/12/2018   Procedure: TOTAL KNEE ARTHROPLASTY;  Surgeon: Rubie Kemps, MD;  Location: [facility] ORS;  Service: Orthopedics;  Laterality: Right;   TUBAL LIGATION  1984   WRIST SURGERY Right    Torn Ligament   Family History History reviewed. No pertinent family history.  Social History Social History   Tobacco Use   Smoking status: Never   Smokeless tobacco: Never  Vaping Use   Vaping status: Never Used  Substance Use Topics   Alcohol use: Never   Drug use: Never   Allergies Ibuprofen, Nsaids, Other, Codeine, Hydrocodone-acetaminophen , Hydromorphone , Morphine  and codeine, Oxycodone , Propoxyphene, Tizanidine, Tramadol hcl, and Prednisone  Review of Systems A thorough review of systems was obtained and all  systems are negative except as noted in the HPI and PMH.   Physical Exam Vital Signs  I have reviewed the triage vital signs BP (!) 150/68 (BP Location: Left Arm)   Pulse 73   Temp 97.9 F (36.6 C) (Oral)   Resp 15   Ht 5' 5 (1.651 m)   Wt 104.3 kg  SpO2 97%   BMI 38.27 kg/m  Physical Exam Vitals and nursing note reviewed.  Constitutional:      General: She is not in acute distress.    Appearance: Normal appearance.  HENT:     Head: Normocephalic and atraumatic.     Right Ear: External ear normal.     Left Ear: External ear normal.     Nose: Nose normal.     Mouth/Throat:     Mouth: Mucous membranes are moist.  Eyes:     General: No scleral icterus.       Right eye: No discharge.        Left eye: No discharge.     Extraocular Movements: Extraocular movements intact.     Pupils: Pupils are equal, round, and reactive to light.  Neck:   Cardiovascular:     Rate and Rhythm: Normal rate and regular rhythm.     Pulses: Normal pulses.     Heart sounds: Normal heart sounds.  Pulmonary:     Effort: Pulmonary effort is normal. No respiratory distress.     Breath sounds: Normal breath sounds. No stridor.  Abdominal:     General: Abdomen is flat. There is no distension.     Palpations: Abdomen is soft.     Tenderness: There is no abdominal tenderness.  Musculoskeletal:     Cervical back: No rigidity.     Right lower leg: No edema.     Left lower leg: No edema.  Skin:    General: Skin is warm and dry.     Capillary Refill: Capillary refill takes less than 2 seconds.  Neurological:     Mental Status: She is alert and oriented to person, place, and time.     GCS: GCS eye subscore is 4. GCS verbal subscore is 5. GCS motor subscore is 6.     Cranial Nerves: Cranial nerves 2-12 are intact. No dysarthria or facial asymmetry.     Sensory: Sensation is intact.     Motor: Motor function is intact. No tremor or pronator drift.     Coordination: Coordination is intact.  Finger-Nose-Finger Test normal.     Comments: Strength 5/5 to BLUE/BLLE, equal and symmetric  Gait testing deferred secondary to patient safety.   Psychiatric:        Mood and Affect: Mood normal.        Behavior: Behavior normal. Behavior is cooperative.     ED Results and Treatments Labs (all labs ordered are listed, but only abnormal results are displayed) Labs Reviewed  TROPONIN I (HIGH SENSITIVITY) - Abnormal; Notable for the following components:      Result Value   Troponin I (High Sensitivity) 178 (*)    All other components within normal limits  TROPONIN I (HIGH SENSITIVITY) - Abnormal; Notable for the following components:   Troponin I (High Sensitivity) 163 (*)    All other components within normal limits  CBC  DIFFERENTIAL  COMPREHENSIVE METABOLIC PANEL WITH GFR  ETHANOL  RAPID URINE DRUG SCREEN, HOSP PERFORMED  PROTIME-INR  APTT  CBG MONITORING, ED  Radiology DG Chest 1 View Result Date: 06/21/2024 EXAM: 1 VIEW(S) XRAY OF THE CHEST 06/21/2024 02:46:00 PM COMPARISON: Comparison 04/13/2020 stable hiatal hernia. CLINICAL HISTORY: Nstemi. Nstemi, HA x Monday, tingling in right fingers, HTN, nonsmoker. FINDINGS: LUNGS AND PLEURA: No focal pulmonary opacity. No pulmonary edema. No pleural effusion. No pneumothorax. HEART AND MEDIASTINUM: No acute abnormality of the cardiac and mediastinal silhouettes. Stable hiatal hernia. BONES AND SOFT TISSUES: No acute osseous abnormality. IMPRESSION: 1. No acute cardiopulmonary process 2. Stable hiatal hernia Electronically signed by: Lynwood Seip MD 06/21/2024 02:55 PM EDT RP Workstation: [id number]   MR BRAIN WO CONTRAST Result Date: 06/21/2024 CLINICAL DATA:  Right hand numbness, headache, and hypertension. EXAM: MRI HEAD WITHOUT CONTRAST TECHNIQUE: Multiplanar, multiecho pulse sequences of the brain and  surrounding structures were obtained without intravenous contrast. COMPARISON:  Head CT 06/21/2024 FINDINGS: The study is intermittently up to moderately motion degraded. Brain: There are punctate acute infarcts in the right middle cerebellar peduncle and more inferiorly in the right cerebellar hemisphere. Small acute supratentorial infarcts involve the splenium of the corpus callosum on the left and left parietal and posterior left frontal cortex and subcortical white matter. The largest infarct is that in the corpus callosum and measures 7 mm. No intracranial hemorrhage, mass, midline shift, or extra-axial fluid collection is identified. There is mild-to-moderate cerebral atrophy. T2 hyperintensities in the cerebral white matter and pons are nonspecific but compatible with mild chronic small vessel ischemic disease. 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 Vascular: Major intracranial vascular flow voids are preserved. Skull and upper cervical spine: Unremarkable bone marrow signal. Sinuses/Orbits: Unremarkable orbits. Paranasal sinuses and mastoid air cells are clear. Other: None. IMPRESSION: 1. Small acute supratentorial and infratentorial infarcts suggestive of emboli. 2. Mild chronic small vessel ischemic disease. Electronically Signed   By: Dasie Hamburg M.D.   On: 06/21/2024 13:21   CT HEAD WO CONTRAST Result Date: 06/21/2024 EXAM: CT HEAD WITHOUT CONTRAST 06/21/2024 10:59:34 AM TECHNIQUE: CT of the head was performed without the administration of intravenous contrast. Automated exposure control, iterative reconstruction, and/or weight based adjustment of the mA/kV was utilized to reduce the radiation dose to as low as reasonably achievable. COMPARISON: None available. CLINICAL HISTORY: Neuro deficit, acute, stroke suspected. Patient bib [facility] from work with complaints of headache, hypertension, tingling in the right hand that comes and goes. EMS  reports neg stroke screen. PMH: migraines with visual spots. FINDINGS: BRAIN AND VENTRICLES: No acute hemorrhage. No evidence of acute infarct. Nonspecific hypoattenuation in the periventricular and subcortical white matter, most likely representing chronic microvascular ischemic changes. Mild generalized parenchymal volume loss. There is a remote infarct in the periventricular white matter adjacent to the right frontal horn partially involving the genu of the corpus callosum. Small remote lacunar infarct in the left thalamus. Mineralization in the basal ganglia and bilateral cerebellum. No hydrocephalus. No extra-axial collection. No mass effect or midline shift. ORBITS: No acute abnormality. SINUSES: No acute abnormality. SOFT TISSUES AND SKULL: No acute soft tissue abnormality. No skull fracture. VASCULATURE: Atherosclerosis of the carotid siphons and intracranial vertebral arteries. IMPRESSION: 1. No acute intracranial abnormality. 2. 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 3. Chronic microvascular ischemic changes and mild volume loss. Electronically signed by: Donnice Mania MD 06/21/2024 11:14 AM EDT RP Workstation: [id number]    Pertinent labs & imaging results that were available during my care of the patient were reviewed by me  and considered in my medical decision making (see MDM for details).  Medications Ordered in ED Medications  lidocaine  (LIDODERM ) 5 % 1 patch (1 patch Transdermal Patch Applied 06/21/24 1426)  sodium chloride  0.9 % bolus 500 mL (0 mLs Intravenous Stopped 06/21/24 1516)  acetaminophen  (TYLENOL ) tablet 1,000 mg (1,000 mg Oral Given 06/21/24 1217)  metoCLOPramide  (REGLAN ) injection 5 mg (5 mg Intravenous Given 06/21/24 1222)  diphenhydrAMINE  (BENADRYL ) injection 25 mg (25 mg Intravenous Given 06/21/24 1218)                                                                                                                                      Procedures .Critical Care  Performed by: Elnor Jayson LABOR, DO Authorized by: Elnor Jayson LABOR, DO   Critical care provider statement:    Critical care time (minutes):  30   Critical care time was exclusive of:  Separately billable procedures and treating other patients   Critical care was necessary to treat or prevent imminent or life-threatening deterioration of the following conditions:  Cardiac failure and CNS failure or compromise   Critical care was time spent personally by me on the following activities:  Development of treatment plan with patient or surrogate, discussions with consultants, evaluation of patient's response to treatment, examination of patient, ordering and review of laboratory studies, ordering and review of radiographic studies, ordering and performing treatments and interventions, pulse oximetry, re-evaluation of patient's condition, review of old charts and obtaining history from patient or surrogate   (including critical care time)  Medical Decision Making / ED Course    Medical Decision Making:    BOB EASTWOOD is a 73 y.o. female with past medical history as below, significant for fibromyalgia, GERD, hypertension, esophageal dysphagia who presents to the ED with complaint of headache, hypertension, numbness to right 4th and 5th digit. . The complaint involves an extensive differential diagnosis and also carries with it a high risk of complications and morbidity.  Serious etiology was considered. Ddx includes but is not limited to: Differential diagnosis includes but is not exclusive to subarachnoid hemorrhage, meningitis, encephalitis, previous head trauma, cavernous venous thrombosis, muscle tension headache, glaucoma, temporal arteritis, migraine or migraine equivalent, etc.   Complete initial physical exam performed, notably the patient was in no acute distress, neuro intact.    Reviewed and confirmed nursing documentation for past medical history, family  history, social history.  Vital signs reviewed.    Headache> - Headache worse than her typical migraine type headaches.  CT head is without acute abnormalities, chronic infarcts noted.  Migraine cocktail. >>  Symptoms resolved - favor complicated migraine   Acute CVA> - Transient numbness to her right 4th and 5th digit which has since improved associated with headache and vision changes typical of her migraine HA - MRI w/ acute supratentorial and infratentorial infarcts suggesting of embolic etiology - Neurology consulted  Elevated troponin> - Troponin 178,  she has no chest pain, EKG is not acutely ischemic - Trend troponin  - cardiology consulted  Admit                Additional history obtained: -Additional history obtained from family -External records from outside source obtained and reviewed including: Chart review including previous notes, labs, imaging, consultation notes including  Home medications  Allergy list   Lab Tests: -I ordered, reviewed, and interpreted labs.   The pertinent results include:   Labs Reviewed  TROPONIN I (HIGH SENSITIVITY) - Abnormal; Notable for the following components:      Result Value   Troponin I (High Sensitivity) 178 (*)    All other components within normal limits  TROPONIN I (HIGH SENSITIVITY) - Abnormal; Notable for the following components:   Troponin I (High Sensitivity) 163 (*)    All other components within normal limits  CBC  DIFFERENTIAL  COMPREHENSIVE METABOLIC PANEL WITH GFR  ETHANOL  RAPID URINE DRUG SCREEN, HOSP PERFORMED  PROTIME-INR  APTT  CBG MONITORING, ED    Notable for trop +  EKG   EKG Interpretation Date/Time:  Wednesday June 21 2024 12:16:57 EDT Ventricular Rate:  81 PR Interval:    QRS Duration:  95 QT Interval:  410 QTC Calculation: 476 R Axis:   -57  Text Interpretation: Left anterior fascicular block Abnormal R-wave progression, late transition flutter? Confirmed by Elnor Savant  ([id number]) on 06/21/2024 1:15:19 PM         Imaging Studies ordered: I ordered imaging studies including MRI brain w/o cth, cxr, cta head/neck, ct cervical (CT neck images pending) I independently visualized the following imaging with scope of interpretation limited to determining acute life threatening conditions related to emergency care; findings noted above I agree with the radiologist interpretation If any imaging was obtained with contrast I closely monitored patient for any possible adverse reaction a/w contrast administration in the emergency department   Medicines ordered and prescription drug management: Meds ordered this encounter  Medications   sodium chloride  0.9 % bolus 500 mL   acetaminophen  (TYLENOL ) tablet 1,000 mg   metoCLOPramide  (REGLAN ) injection 5 mg   diphenhydrAMINE  (BENADRYL ) injection 25 mg   lidocaine  (LIDODERM ) 5 % 1 patch    -I have reviewed the patients home medicines and have made adjustments as needed   Consultations Obtained: I requested consultation with the neurology, cardiology,  and discussed lab and imaging findings as well as pertinent plan   Cardiac Monitoring: The patient was maintained on a cardiac monitor.  I personally viewed and interpreted the cardiac monitored which showed an underlying rhythm of: nsr Continuous pulse oximetry interpreted by myself, 98% on RA.    Social Determinants of Health:  Diagnosis or treatment significantly limited by social determinants of health: obesity   Reevaluation: After the interventions noted above, I reevaluated the patient and found that they have improved  Co morbidities that complicate the patient evaluation  Past Medical History:  Diagnosis Date   Arthritis    Complication of anesthesia    Family history of adverse reaction to anesthesia    Fibromyalgia    GERD (gastroesophageal reflux disease)    Hypertension    PONV (postoperative nausea and vomiting)    Sleep apnea    uses mouth  guard      Dispostion: Disposition decision including need for hospitalization was considered, and patient admitted to the hospital.    Final Clinical Impression(s) / ED Diagnoses Final diagnoses:  Acute CVA (cerebrovascular accident) (  HCC)  Elevated troponin  Complicated migraine        Elnor Jayson LABOR, DO 06/21/24 1518

## 2024-06-21 NOTE — ED Notes (Signed)
 Physician at bedside.

## 2024-06-21 NOTE — ED Notes (Signed)
Phlebotomy contacted for blood work.

## 2024-06-21 NOTE — ED Notes (Signed)
 Physician notified of critical troponin value.
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
## 2024-06-21 NOTE — ED Provider Triage Note (Signed)
 Emergency Medicine Provider Triage Evaluation Note  Kendra [name] , a 73 y.o. female  was evaluated in triage.  Pt complains of headache and numbness to right 4th and 5th fingers.  Pt had a migraine on 10/12 and took tylenol  and it eventually went away.  Today, she was at work and the headache came back much worse and she had some numbness to her fingers.  She saw some sparklies in her right eye as well.  Headache is down to a 1.  Numbness is gone.  Vision is normal now.    Review of Systems  Positive: Headache, numbness Negative: weakness  Physical Exam  BP (!) 181/85   Pulse 82   Temp 97.9 F (36.6 C)   Resp 16   Ht 5' 5 (1.651 m)   Wt 104.3 kg   SpO2 97%   BMI 38.27 kg/m  Gen:   Awake, no distress   Resp:  Normal effort  MSK:   Moves extremities without difficulty  Other:    Medical Decision Making  Medically screening exam initiated at 10:56 AM.  Appropriate orders placed.  Kendra [name] was informed that the remainder of the evaluation will be completed by another provider, this initial triage assessment does not replace that evaluation, and the importance of remaining in the ED until their evaluation is complete.    Dean Clarity, MD 06/21/24 1058

## 2024-06-22 ENCOUNTER — Inpatient Hospital Stay (HOSPITAL_COMMUNITY)

## 2024-06-22 ENCOUNTER — Observation Stay (HOSPITAL_COMMUNITY)

## 2024-06-22 DIAGNOSIS — I10 Essential (primary) hypertension: Secondary | ICD-10-CM

## 2024-06-22 DIAGNOSIS — R569 Unspecified convulsions: Secondary | ICD-10-CM

## 2024-06-22 DIAGNOSIS — I6340 Cerebral infarction due to embolism of unspecified cerebral artery: Secondary | ICD-10-CM | POA: Diagnosis not present

## 2024-06-22 DIAGNOSIS — I639 Cerebral infarction, unspecified: Secondary | ICD-10-CM

## 2024-06-22 DIAGNOSIS — E785 Hyperlipidemia, unspecified: Secondary | ICD-10-CM | POA: Diagnosis not present

## 2024-06-22 LAB — ECHOCARDIOGRAM COMPLETE
Area-P 1/2: 4.15 cm2
Height: 65 in
MV VTI: 1.26 cm2
S' Lateral: 2.59 cm
Weight: 3680 [oz_av]

## 2024-06-22 NOTE — TOC Transition Note (Signed)
 Transition of Care [facility]) - Discharge Note   Patient Details  Name: Kendra [name] MRN: [id number] Date of Birth: 15-Nov-1950  Transition of Care [facility]) CM/SW Contact:  Andrez JULIANNA George, RN Phone Number: 06/22/2024, 2:50 PM   Clinical Narrative:     Pt is discharging home with outpatient therapy through [facility] Outpatient therapy. Information is on the AVS. [facility] will contact her for the first appointment. Pt has needed DME at home: cane/ walker/ BSC/ ramp/ shower seat/ wheelchair/ scooter Pt was driving but her sons can assist with transportation.  Son lives next door.  She manages her own medications. Sons will transport home.  Final next level of care: OP Rehab Barriers to Discharge: No Barriers Identified   Patient Goals and CMS Choice     Choice offered to / list presented to : Patient      Discharge Placement                       Discharge Plan and Services Additional resources added to the After Visit Summary for                                       Social Drivers of Health (SDOH) Interventions SDOH Screenings   Food Insecurity: No Food Insecurity (06/21/2024)  Housing: Low Risk  (06/21/2024)  Transportation Needs: No Transportation Needs (06/21/2024)  Utilities: Not At Risk (06/21/2024)  Social Connections: Moderately Integrated (06/21/2024)  Tobacco Use: Low Risk  (06/21/2024)     Readmission Risk Interventions     No data to display

## 2024-06-22 NOTE — Progress Notes (Signed)
  Echocardiogram 2D Echocardiogram has been performed.  Koleen KANDICE Popper, RDCS 06/22/2024, 9:42 AM

## 2024-06-22 NOTE — Evaluation (Signed)
 Occupational Therapy Evaluation Patient Details Name: Kendra [name] MRN: [id number] DOB: October 29, 1950 Today's Date: 06/22/2024   History of Present Illness   73 y.o. female presents to [facility] 06/21/24 with h/a and numbness in R hand. 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 PMHx: arthritis, hypertension, fibromyalgia, sleep apnea     Clinical Impressions Kendra [name] was evaluated s/p the above admission list. She is indep at baseline. Upon evaluation the pt was limited by impaired vision and safety awareness. Overall she is mod I for mobility ADLs. Pt has good awareness of visual symptoms and describes her vision to be like she is looking through a net and her peripheral vision has sparling/colorful floaters. Pt is compensating well to see her full field. She is able to read normal print with increased time, confrontation testing, scanning and visual attention was [facility]. She is not seeing colors very accurately. He vision does not get better/worse with occlusion. Pt will benefit from continued acute OT services and OP neuro OT.      If plan is discharge home, recommend the following:   Assist for transportation     Functional Status Assessment   Patient has had a recent decline in their functional status and demonstrates the ability to make significant improvements in function in a reasonable and predictable amount of time.     Equipment Recommendations   None recommended by OT      Precautions/Restrictions   Precautions Precautions: Fall Restrictions Weight Bearing Restrictions Per Provider Order: No     Mobility Bed Mobility               General bed mobility comments: seated on EOB upon arrival    Transfers Overall transfer level: Modified  independent Equipment used: None                      Balance Overall balance assessment: Needs assistance Sitting-balance support: No upper extremity supported, Feet supported Sitting balance-Leahy Scale: Normal     Standing balance support: No upper extremity supported Standing balance-Leahy Scale: Fair                 ADL either performed or assessed with clinical judgement   ADL Overall ADL's : Modified independent             General ADL Comments: mod I for ADLs, no DME. some cues given for safety and vision strategies     Vision Baseline Vision/History: 0 No visual deficits Vision Assessment?: Vision impaired- to be further tested in functional context Additional Comments: pt is seeing floaters and twinkling objects that move in her peripheral vision and says it is like looking through a next in her main field of vision. scanning WFL, able to read with increased time/effort. controntation testing was [facility].     Perception Perception: Not tested       Praxis Praxis: Not tested       Pertinent Vitals/Pain Pain Assessment Pain Assessment: Faces Faces Pain Scale: Hurts a little bit Pain Location: neck Pain Descriptors / Indicators: Discomfort, Aching Pain Intervention(s): Monitored during session     Extremity/Trunk Assessment Upper Extremity Assessment Upper Extremity Assessment: Overall WFL for tasks assessed   Lower Extremity Assessment Lower Extremity Assessment: Defer to PT evaluation   Cervical / Trunk Assessment Cervical / Trunk Assessment: Normal  Communication Communication Communication: No apparent difficulties   Cognition Arousal: Alert Behavior During Therapy: WFL for tasks assessed/performed Cognition: No apparent impairments             Following commands: Impaired Following commands impaired: Follows multi-step commands inconsistently, Follows multi-step commands with increased time     Cueing  General  Comments   Cueing Techniques: Verbal cues;Tactile cues  VSS           Home Living Family/patient expects to be discharged to:: Private residence Living Arrangements: Alone Available Help at Discharge: Family;Available PRN/intermittently Type of Home: House Home Access: Ramped entrance     Home Layout: One level     Bathroom Shower/Tub: Producer, television/film/video: Handicapped height Bathroom Accessibility: Yes   Home Equipment: Agricultural consultant (2 wheels);Rollator (4 wheels);Cane - single point;Crutches;Grab bars - tub/shower;Electric scooter          Prior Functioning/Environment Prior Level of Function : Independent/Modified Independent;Driving             Mobility Comments: Ind with no AD ADLs Comments: Ind, works, drives    OT Problem List: Impaired vision/perception   OT Treatment/Interventions: Therapeutic exercise;Self-care/ADL training;Neuromuscular education;Visual/perceptual remediation/compensation      OT Goals(Current goals can be found in the care plan section)   Acute Rehab OT Goals Patient Stated Goal: to go home OT Goal Formulation: With patient Time For Goal Achievement: 07/06/24 Potential to Achieve Goals: Good ADL Goals Additional ADL Goal #1: Pt will independently use visual scanning strategies during functional tasks to increased safety (decrease fall risk)   OT Frequency:  Min 2X/week       AM-PAC OT 6 Clicks Daily Activity     Outcome Measure Help from another person eating meals?: None Help from another person taking care of personal grooming?: None Help from another person toileting, which includes using toliet, bedpan, or urinal?: None Help from another person bathing (including washing, rinsing, drying)?: None Help from another person to put on and taking off regular upper body clothing?: None Help from another person to put on and taking off regular lower body clothing?: None 6 Click Score: 24   End of Session  Nurse Communication: Mobility status  Activity Tolerance: Patient tolerated treatment well Patient left: in bed;with call bell/phone within reach;with family/visitor present  OT Visit Diagnosis: Low vision, both eyes (H54.2)                Time: 8786-8764 OT Time Calculation (min): 22 min Charges:  OT General Charges $OT Visit: 1 Visit OT Evaluation $OT Eval Moderate Complexity: 1 Mod  Lucie Kendall, OTR/L Acute Rehabilitation Services Office [phone] Secure Chat Communication Preferred   Lucie JONETTA Kendall 06/22/2024, 1:43 PM

## 2024-06-22 NOTE — Progress Notes (Signed)
   Progress Note  Patient Name: Kendra [name] Date of Encounter: 06/22/2024 Primary Cardiologist: Stanly DELENA Leavens, MD   Subjective   Overnight seizure like activity; see on EGD.  Vital Signs    Vitals:   06/21/24 2337 06/22/24 0411 06/22/24 0743 06/22/24 1139  BP: (!) 151/59 (!) 168/76 (!) 166/59 (!) 154/72  Pulse: 76 84 75 84  Resp: 17  18 18   Temp: 98.3 F (36.8 C) (!) 97.5 F (36.4 C) 97.8 F (36.6 C) 97.8 F (36.6 C)  TempSrc: Oral Oral Oral Oral  SpO2: 96% 95% 97% 97%  Weight:      Height:        Intake/Output Summary (Last 24 hours) at 06/22/2024 1357 Last data filed at 06/22/2024 0249 Gross per 24 hour  Intake 806.36 ml  Output --  Net 806.36 ml   Filed Weights   06/21/24 1028  Weight: 104.3 kg    Physical Exam   GEN: No acute distress.  Morbid Obesity. Neck: No JVD Cardiac: RRR, no murmurs, rubs, or gallops.  Respiratory: Clear to auscultation bilaterally. GI: Soft, nontender, non-distended  MS: No edema  Labs   Telemetry: SR   Chemistry Recent Labs  Lab 06/21/24 1049  NA 142  K 3.8  CL 103  CO2 24  GLUCOSE 97  BUN 11  CREATININE 0.74  CALCIUM 9.0  PROT 7.0  ALBUMIN 3.9  AST 27  ALT 17  ALKPHOS 90  BILITOT 0.5  GFRNONAA >60  ANIONGAP 15     Hematology Recent Labs  Lab 06/21/24 1049  WBC 8.3  RBC 4.61  HGB 14.1  HCT 44.5  MCV 96.5  MCH 30.6  MCHC 31.7  RDW 13.2  PLT 276    Assessment & Plan    Non-ST elevation myocardial infarction (NSTEMI) likely secondary to hypertensive emergency and acute ischemic stroke - Continue dual antiplatelet therapy as per neurology recommendations; has stomach issues in the past on ASA but currently doing well - no thrombus on Echo, normal function , no plans for inpatient ischemic work up - MAC noted on Echo   Hypertensive emergency and essential hypertension - likely restart ARB when post hypertensive urgency recommendations of    Aortic atherosclerosis and minimal  carotid artery atherosclerosis - CT angiography of the head and neck reveals aortic atherosclerosis and minimal carotid plaques. These findings are significant for vascular disease, which may contribute to her cardiovascular risk profile. - LDL goal < 70; on atorvastatin   Stroke - if no thrombi in legs planned for peri-discharge ILR  For questions or updates, please contact [facility] Please consult [email or web address] for contact info under Cardiology/STEMI.      Stanly Leavens, MD FASE [facility] Cardiologist [facility]  [address] [place], [address] [place], [place] [address] [phone]  1:57 PM

## 2024-06-22 NOTE — Progress Notes (Signed)
 STROKE TEAM PROGRESS NOTE   SUBJECTIVE (INTERVAL HISTORY) Her sons are at the bedside.  Overall her condition is stable. She stated that she had hx of migraine with aura, typically visual aura with spinning objects lasting about . It happen 3-4 times a year. Last one occurred last Sunday. Then on Monday she stated to have headache at R top of head without aura, persistent. Wednesday she had R ring finger and pinky numbness. MRI showed embolic strokes.    OBJECTIVE Temp:  [97.5 F (36.4 C)-98.7 F (37.1 C)] 97.8 F (36.6 C) (10/16 1139) Pulse Rate:  [73-89] 84 (10/16 1139) Cardiac Rhythm: Normal sinus rhythm (10/16 0411) Resp:  [15-19] 18 (10/16 1139) BP: (150-190)/(59-80) 154/72 (10/16 1139) SpO2:  [95 %-100 %] 97 % (10/16 1139)  Recent Labs  Lab 06/21/24 1133  GLUCAP 97   Recent Labs  Lab 06/21/24 1049  NA 142  K 3.8  CL 103  CO2 24  GLUCOSE 97  BUN 11  CREATININE 0.74  CALCIUM 9.0   Recent Labs  Lab 06/21/24 1049  AST 27  ALT 17  ALKPHOS 90  BILITOT 0.5  PROT 7.0  ALBUMIN 3.9   Recent Labs  Lab 06/21/24 1049  WBC 8.3  NEUTROABS 5.6  HGB 14.1  HCT 44.5  MCV 96.5  PLT 276   No results for input(s): CKTOTAL, CKMB, CKMBINDEX, TROPONINI in the last 168 hours. Recent Labs    06/21/24 1359  LABPROT 13.4  INR 1.0   No results for input(s): COLORURINE, LABSPEC, PHURINE, GLUCOSEU, HGBUR, BILIRUBINUR, KETONESUR, PROTEINUR, UROBILINOGEN, NITRITE, LEUKOCYTESUR in the last 72 hours.  Invalid input(s): APPERANCEUR     Component Value Date/Time   CHOL 275 (H) 06/21/2024 1843   TRIG 293 (H) 06/21/2024 1843   HDL 51 06/21/2024 1843   CHOLHDL 5.4 06/21/2024 1843   VLDL 59 (H) 06/21/2024 1843   LDLCALC 165 (H) 06/21/2024 1843   Lab Results  Component Value Date   HGBA1C 5.5 06/21/2024      Component Value Date/Time   LABOPIA NONE DETECTED 06/21/2024 1150   COCAINSCRNUR NONE DETECTED 06/21/2024 1150   LABBENZ NONE  DETECTED 06/21/2024 1150   AMPHETMU NONE DETECTED 06/21/2024 1150   THCU NONE DETECTED 06/21/2024 1150   LABBARB NONE DETECTED 06/21/2024 1150    Recent Labs  Lab 06/21/24 1049  ETH <15    I have personally reviewed the radiological images below and agree with the radiology interpretations.  CT C-SPINE NO CHARGE Result Date: 06/21/2024 CLINICAL DATA:  Neuro deficit, acute, stroke suspected. Numbness in the fourth and fifth digits of the right hand. EXAM: CT CERVICAL SPINE WITH CONTRAST TECHNIQUE: Multiplanar CT images of the cervical spine were reconstructed from contemporary CTA of the Neck. RADIATION DOSE REDUCTION: This exam was performed according to the departmental dose-optimization program which includes automated exposure control, adjustment of the mA and/or kV according to patient size and/or use of iterative reconstruction technique. CONTRAST:  No additional. COMPARISON:  None Available. FINDINGS: Alignment: Reversal of the normal cervical lordosis. Trace anterolisthesis of C7 on T1. Skull base and vertebrae: No acute fracture or suspicious lesion. Soft tissues and spinal canal: No prevertebral fluid or swelling. No visible canal hematoma. Disc levels: Moderate disc space narrowing and degenerative endplate changes at C4-5, C5-6, and C7-T1. Asymmetrically advanced facet arthrosis on the left at C2-3 and on the right at C3-4. Mild spinal stenosis at C4-5 and C5-6. Moderate left neural foraminal stenosis at C5-6 and no more than mild apparent  neural foraminal stenosis elsewhere. No evidence of high-grade right-sided neural foraminal stenosis in the lower cervical spine to correlate with the patient's right hand numbness. Upper chest: No mass or consolidation in the included lung apices. Other: CTA reported separately. IMPRESSION: 1. No acute osseous abnormality. 2. Cervical disc and facet degeneration with mild spinal stenosis and moderate left neural foraminal stenosis at C5-6. Electronically  Signed   By: Dasie Hamburg M.D.   On: 06/21/2024 16:40   CT ANGIO HEAD NECK W WO CM Result Date: 06/21/2024 CLINICAL DATA:  Neuro deficit, acute, stroke suspected. Right hand tingling, headache, and hypertension. EXAM: CT ANGIOGRAPHY HEAD AND NECK WITH AND WITHOUT CONTRAST TECHNIQUE: Multidetector CT imaging of the head and neck was performed using the standard protocol during bolus administration of intravenous contrast. Multiplanar CT image reconstructions and MIPs were obtained to evaluate the vascular anatomy. Carotid stenosis measurements (when applicable) are obtained utilizing NASCET criteria, using the distal internal carotid diameter as the denominator. RADIATION DOSE REDUCTION: This exam was performed according to the departmental dose-optimization program which includes automated exposure control, adjustment of the mA and/or kV according to patient size and/or use of iterative reconstruction technique. CONTRAST:  75mL OMNIPAQUE IOHEXOL 350 MG/ML SOLN COMPARISON:  None Available. FINDINGS: CTA NECK FINDINGS Aortic arch: Standard branching with moderate atherosclerotic calcification. Patent brachiocephalic and subclavian arteries without evidence of a significant stenosis. Right carotid system: Patent with a small amount of scattered plaque in the common carotid artery and a moderate amount of calcified plaque in the carotid bulb. No evidence of a significant stenosis or dissection. Left carotid system: Patent with mild scattered atherosclerotic plaque in the common carotid artery and at the carotid bifurcation. No evidence of a significant stenosis or dissection. Vertebral arteries: Patent and codominant with calcified plaque at both vertebral artery origins resulting in at most mild stenosis. No evidence of a dissection. Skeleton: Cervical spine reported separately. No destructive process. Other neck: No evidence of cervical lymphadenopathy or mass. Upper chest: No mass or consolidation in the included  lung apices. Review of the MIP images confirms the above findings CTA HEAD FINDINGS Anterior circulation: The internal carotid arteries are patent from skull base to carotid termini with mild atherosclerosis not resulting in significant stenosis. ACAs and MCAs are patent without evidence of a proximal branch occlusion or significant proximal stenosis. No aneurysm is identified. Posterior circulation: The intracranial vertebral arteries are patent with calcified plaque resulting in mild stenosis bilaterally. Patent PICA, AICA, and SCA origins are visualized bilaterally. The basilar artery is widely patent. Posterior communicating arteries are diminutive or absent. Both PCAs are patent without evidence of a significant proximal stenosis. No aneurysm is identified. Venous sinuses: Patent. Anatomic variants: None. Review of the MIP images confirms the above findings IMPRESSION: 1. Mild atherosclerosis in the head and neck without a large vessel occlusion or flow limiting proximal stenosis. 2.  Aortic Atherosclerosis (ICD10-I70.0). Electronically Signed   By: Dasie Hamburg M.D.   On: 06/21/2024 16:10   DG Chest 1 View Result Date: 06/21/2024 EXAM: 1 VIEW(S) XRAY OF THE CHEST 06/21/2024 02:46:00 PM COMPARISON: Comparison 04/13/2020 stable hiatal hernia. CLINICAL HISTORY: Nstemi. Nstemi, HA x Monday, tingling in right fingers, HTN, nonsmoker. FINDINGS: LUNGS AND PLEURA: No focal pulmonary opacity. No pulmonary edema. No pleural effusion. No pneumothorax. HEART AND MEDIASTINUM: No acute abnormality of the cardiac and mediastinal silhouettes. Stable hiatal hernia. BONES AND SOFT TISSUES: No acute osseous abnormality. IMPRESSION: 1. No acute cardiopulmonary process 2. Stable hiatal hernia Electronically  signed by: Lynwood Seip MD 06/21/2024 02:55 PM EDT RP Workstation: [id number]   MR BRAIN WO CONTRAST Result Date: 06/21/2024 CLINICAL DATA:  Right hand numbness, headache, and hypertension. EXAM: MRI HEAD WITHOUT  CONTRAST TECHNIQUE: Multiplanar, multiecho pulse sequences of the brain and surrounding structures were obtained without intravenous contrast. COMPARISON:  Head CT 06/21/2024 FINDINGS: The study is intermittently up to moderately motion degraded. Brain: There are punctate acute infarcts in the right middle cerebellar peduncle and more inferiorly in the right cerebellar hemisphere. Small acute supratentorial infarcts involve the splenium of the corpus callosum on the left and left parietal and posterior left frontal cortex and subcortical white matter. The largest infarct is that in the corpus callosum and measures 7 mm. No intracranial hemorrhage, mass, midline shift, or extra-axial fluid collection is identified. There is mild-to-moderate cerebral atrophy. T2 hyperintensities in the cerebral white matter and pons are nonspecific but compatible with mild chronic small vessel ischemic disease. 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 Vascular: Major intracranial vascular flow voids are preserved. Skull and upper cervical spine: Unremarkable bone marrow signal. Sinuses/Orbits: Unremarkable orbits. Paranasal sinuses and mastoid air cells are clear. Other: None. IMPRESSION: 1. Small acute supratentorial and infratentorial infarcts suggestive of emboli. 2. Mild chronic small vessel ischemic disease. Electronically Signed   By: Dasie Hamburg M.D.   On: 06/21/2024 13:21   CT HEAD WO CONTRAST Result Date: 06/21/2024 EXAM: CT HEAD WITHOUT CONTRAST 06/21/2024 10:59:34 AM TECHNIQUE: CT of the head was performed without the administration of intravenous contrast. Automated exposure control, iterative reconstruction, and/or weight based adjustment of the mA/kV was utilized to reduce the radiation dose to as low as reasonably achievable. COMPARISON: None available. CLINICAL HISTORY: Neuro deficit, acute, stroke suspected. Patient bib [facility] from work with complaints of  headache, hypertension, tingling in the right hand that comes and goes. EMS reports neg stroke screen. PMH: migraines with visual spots. FINDINGS: BRAIN AND VENTRICLES: No acute hemorrhage. No evidence of acute infarct. Nonspecific hypoattenuation in the periventricular and subcortical white matter, most likely representing chronic microvascular ischemic changes. Mild generalized parenchymal volume loss. There is a remote infarct in the periventricular white matter adjacent to the right frontal horn partially involving the genu of the corpus callosum. Small remote lacunar infarct in the left thalamus. Mineralization in the basal ganglia and bilateral cerebellum. No hydrocephalus. No extra-axial collection. No mass effect or midline shift. ORBITS: No acute abnormality. SINUSES: No acute abnormality. SOFT TISSUES AND SKULL: No acute soft tissue abnormality. No skull fracture. VASCULATURE: Atherosclerosis of the carotid siphons and intracranial vertebral arteries. IMPRESSION: 1. No acute intracranial abnormality. 2. 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 3. Chronic microvascular ischemic changes and mild volume loss. Electronically signed by: Donnice Mania MD 06/21/2024 11:14 AM EDT RP Workstation: [id number]     PHYSICAL EXAM  Temp:  [97.5 F (36.4 C)-98.7 F (37.1 C)] 97.8 F (36.6 C) (10/16 1139) Pulse Rate:  [73-89] 84 (10/16 1139) Resp:  [15-19] 18 (10/16 1139) BP: (150-190)/(59-80) 154/72 (10/16 1139) SpO2:  [95 %-100 %] 97 % (10/16 1139)  General - Well nourished, well developed, in no apparent distress.  Ophthalmologic - fundi not visualized due to noncooperation.  Cardiovascular - Regular rhythm and rate.  Mental Status -  Level of arousal and orientation to time, place, and person were intact. Language including expression, naming, repetition, comprehension was assessed and found intact. Fund of Knowledge was  assessed and was intact.  Cranial  Nerves II - XII - II - Visual field intact OU. Occasionally complaining of strange visual disturbance like people's hair fuzzy and lifting up, black spots on the wall or people's face. III, IV, VI - Extraocular movements intact. V - Facial sensation intact bilaterally. VII - Facial movement intact bilaterally. VIII - Hearing & vestibular intact bilaterally. X - Palate elevates symmetrically. XI - Chin turning & shoulder shrug intact bilaterally. XII - Tongue protrusion intact.  Motor Strength - The patient's strength was normal in all extremities and pronator drift was absent.  Bulk was normal and fasciculations were absent.   Motor Tone - Muscle tone was assessed at the neck and appendages and was normal.  Reflexes - The patient's reflexes were symmetrical in all extremities and she had no pathological reflexes.  Sensory - Light touch, temperature/pinprick were assessed and were symmetrical.    Coordination - The patient had normal movements in the hands and feet with no ataxia or dysmetria.  Tremor was absent.  Gait and Station - deferred.   ASSESSMENT/PLAN Ms. LATRONDA SPINK is a 73 y.o. female with history of HTN, OSA, fibromyalgia, migraine with visual aura admitted for R side HA and R finger numbness. No TNK given due to outside window.    Stroke:  multifocal bilateral small infarcts embolic pattern, concerning for cardioembolic source CT head no acute finding, old left thalamic, R periventricular WM infarcts. CTA head and neck unremarkable  MRI  Small acute supratentorial and infratentorial infarcts suggestive of emboli. 2D Echo  pending LE venous doppler pending Consider loop recorder if work up neg EEG pending for episodic visual disturbance but most likely due to L splenium infarct LDL 165, TG 293 HgbA1c 5.5 UDS neg Lovenox  for VTE prophylaxis No antithrombotic prior to admission, now on clopidogrel 75 mg daily. Pt has ASA allergy Patient counseled to be compliant  with her antithrombotic medications Ongoing aggressive stroke risk factor management Therapy recommendations:  outpt PT Disposition:  none  Hypertension Stable on the high end Gradually normalize BP in 2-3 days Long term BP goal normotensive  Hyperlipidemia Home meds:  none  LDL 165, goal < 70 TG 293 Now on lipitor 40 Continue statin at discharge  Other Stroke Risk Factors Advanced age Obesity, Body mass index is 38.27 kg/m.  Obstructive sleep apnea, on CPAP at home  Other Active Problems Fibromyalgia   Hospital day # 1    Ary Cummins, MD PhD Stroke Neurology 06/22/2024 12:38 PM    To contact Stroke Continuity provider, please refer to [email or web address]. After hours, contact General Neurology

## 2024-06-22 NOTE — Procedures (Signed)
 Patient Name: Kendra [name]  MRN: [id number]  Epilepsy Attending: Arlin MALVA Krebs  Referring Physician/Provider: Jerri Pfeiffer, MD  Date: 06/22/2024 Duration: 23.20 mins  Patient history: 73yo f with episodic visual disturbance.. EEG to evaluate for seizure  Level of alertness: Awake, asleep  AEDs during EEG study: GBP  Technical aspects: This EEG study was done with scalp electrodes positioned according to the 10-20 International system of electrode placement. Electrical activity was reviewed with band pass filter of 1-70Hz , sensitivity of 7 uV/mm, display speed of 64mm/sec with a 60Hz  notched filter applied as appropriate. EEG data were recorded continuously and digitally stored.  Video monitoring was available and reviewed as appropriate.  Description: The posterior dominant rhythm consists of 8 Hz activity of moderate voltage (25-35 uV) seen predominantly in posterior head regions, symmetric and reactive to eye opening and eye closing. Sleep was characterized by vertex waves, sleep spindles (12 to 14 Hz), maximal frontocentral region.  Hyperventilation and photic stimulation were not performed.     IMPRESSION: This study is within normal limits. No seizures or epileptiform discharges were seen throughout the recording.  A normal interictal EEG does not exclude the diagnosis of epilepsy.   Shelvy Heckert O Adalida Garver

## 2024-06-22 NOTE — Progress Notes (Signed)
 EEG complete. Results pending.  ?

## 2024-06-22 NOTE — Progress Notes (Signed)
 PROGRESS NOTE  Kendra [name]  FMW:[id number] DOB: Dec 15, 1950 DOA: 06/21/2024 PCP: Gladis Dannielle DEL, PA-C   Brief Narrative: Patient is a 73 year old female with history of hypertension, fibromyalgia, sleep apnea, GERD, migraine headache who presented with complaint of headache, numbness in the right hand.  On presentation, she was hypertensive.  Lab work showed elevated troponin.  CT scan of the head did not show any acute findings but showed remote infarction in the right periventricular white matter and left thalamus.  MRI of the brain showed scattered infarcts :  punctate acute infarcts in the right middle cerebellar peduncle , right cerebellar hemisphere, small acute supratentorial infarcts involve the splenium of the corpus callosum on the left and left parietal and posterior left frontal cortex and subcortical white matter.Patient admitted for the stroke workup.  Neurology consulted.  Assessment & Plan:  Principal Problem:   CVA (cerebral vascular accident) (HCC) Active Problems:   Elevated troponin   Essential hypertension   Hyperlipidemia   History of migraine headaches   Neuropathy   Obesity, Class II, BMI 35-39.9   Hiatal hernia with GERD  Acute CVA: Presented with right-sided headache, numbness on the right 4th and 5th fingers. CT scan of the head did not show any acute findings but showed remote infarction in the right periventricular white matter and left thalamus.  MRI of the brain showed  scattered infarcts :  punctate acute infarcts in the right middle cerebellar peduncle , right cerebellar hemisphere, small acute supratentorial infarcts involve the splenium of the corpus callosum on the left and left parietal and posterior left frontal cortex and subcortical white matter. Neurology consulted.  CTA head and neck did not show any large vessel occlusion.  LDL of 165, A1c of 5.5.  Lower extremity Doppler, echocardiogram pending.  PT/OT/speech consulted.  Allergic to aspirin  so on Plavix only. Patient is ambulatory at baseline and works 3 times a week.  Elevated troponin: Denies chest pain.  Likely demand ischemia secondary to hypertensive emergency, stroke.  Follow-up echocardiogram.  Cardiology was following and recommended outpatient workup for ischemic evaluation  Hyperlipidemia: LDL of  165.  Started on Lipitor.  CT angiogram of the head and neck showed aortic atherosclerosis, minimal carotid plaques  History of hypertension: Takes valsartan, amlodipine at home.  Hypertensive on presentation.  Allow permissive hypertension.  Gradually normalize blood in 3 to 5 days.  Continue as needed medication for severe hypertension  History of migraine headaches: Was having right-sided headache on presentation.  Given Tylenol , Benadryl , Reglan  in the ED.  He is to follow-up with neurology as an outpatient for the management of chronic migraine  History of neuropathy: Fibromyalgia on gabapentin   GERD/hiatal hernia: Currently not on any medications  Obesity: BMI 38.2         DVT prophylaxis:enoxaparin  (LOVENOX ) injection 40 mg Start: 06/21/24 2200     Code Status: Full Code  Family Communication: None at the bedside  Patient status:Inpatient  Patient is from :Home  Anticipated discharge un:ynfz  Estimated DC date:after full work up   Consultants: Neurology, cardiology  Procedures: None  Antimicrobials:  Anti-infectives (From admission, onward)    None       Subjective: Patient seen and examined the bedside today.  Hemodynamically stable.  No focal deficits or weakness.  Has some changes in the vision and seeing patterns.  Speech is clear.  No numbness in the right upper extremity  Objective: Vitals:   06/21/24 1920 06/21/24 2337 06/22/24 0411 06/22/24 0743  BP: ROLLEN)  163/73 (!) 151/59 (!) 168/76 (!) 166/59  Pulse: 82 76 84 75  Resp: 16 17  18   Temp: 98.7 F (37.1 C) 98.3 F (36.8 C) (!) 97.5 F (36.4 C) 97.8 F (36.6 C)  TempSrc: Oral  Oral Oral Oral  SpO2: 97% 96% 95% 97%  Weight:      Height:        Intake/Output Summary (Last 24 hours) at 06/22/2024 0808 Last data filed at 06/22/2024 0249 Gross per 24 hour  Intake 806.36 ml  Output --  Net 806.36 ml   Filed Weights   06/21/24 1028  Weight: 104.3 kg    Examination:  General exam: Overall comfortable, not in distress,obese HEENT: PERRL Respiratory system:  no wheezes or crackles  Cardiovascular system: S1 & S2 heard, RRR.  Gastrointestinal system: Abdomen is nondistended, soft and nontender. Central nervous system: Alert and oriented Extremities: No edema, no clubbing ,no cyanosis Skin: No rashes, no ulcers,no icterus     Data Reviewed: I have personally reviewed following labs and imaging studies  CBC: Recent Labs  Lab 06/21/24 1049  WBC 8.3  NEUTROABS 5.6  HGB 14.1  HCT 44.5  MCV 96.5  PLT 276   Basic Metabolic Panel: Recent Labs  Lab 06/21/24 1049  NA 142  K 3.8  CL 103  CO2 24  GLUCOSE 97  BUN 11  CREATININE 0.74  CALCIUM 9.0     No results found for this or any previous visit (from the past 240 hours).   Radiology Studies: CT C-SPINE NO CHARGE Result Date: 06/21/2024 CLINICAL DATA:  Neuro deficit, acute, stroke suspected. Numbness in the fourth and fifth digits of the right hand. EXAM: CT CERVICAL SPINE WITH CONTRAST TECHNIQUE: Multiplanar CT images of the cervical spine were reconstructed from contemporary CTA of the Neck. RADIATION DOSE REDUCTION: This exam was performed according to the departmental dose-optimization program which includes automated exposure control, adjustment of the mA and/or kV according to patient size and/or use of iterative reconstruction technique. CONTRAST:  No additional. COMPARISON:  None Available. FINDINGS: Alignment: Reversal of the normal cervical lordosis. Trace anterolisthesis of C7 on T1. Skull base and vertebrae: No acute fracture or suspicious lesion. Soft tissues and spinal canal: No  prevertebral fluid or swelling. No visible canal hematoma. Disc levels: Moderate disc space narrowing and degenerative endplate changes at C4-5, C5-6, and C7-T1. Asymmetrically advanced facet arthrosis on the left at C2-3 and on the right at C3-4. Mild spinal stenosis at C4-5 and C5-6. Moderate left neural foraminal stenosis at C5-6 and no more than mild apparent neural foraminal stenosis elsewhere. No evidence of high-grade right-sided neural foraminal stenosis in the lower cervical spine to correlate with the patient's right hand numbness. Upper chest: No mass or consolidation in the included lung apices. Other: CTA reported separately. IMPRESSION: 1. No acute osseous abnormality. 2. Cervical disc and facet degeneration with mild spinal stenosis and moderate left neural foraminal stenosis at C5-6. Electronically Signed   By: Dasie Hamburg M.D.   On: 06/21/2024 16:40   CT ANGIO HEAD NECK W WO CM Result Date: 06/21/2024 CLINICAL DATA:  Neuro deficit, acute, stroke suspected. Right hand tingling, headache, and hypertension. EXAM: CT ANGIOGRAPHY HEAD AND NECK WITH AND WITHOUT CONTRAST TECHNIQUE: Multidetector CT imaging of the head and neck was performed using the standard protocol during bolus administration of intravenous contrast. Multiplanar CT image reconstructions and MIPs were obtained to evaluate the vascular anatomy. Carotid stenosis measurements (when applicable) are obtained utilizing NASCET criteria, using  the distal internal carotid diameter as the denominator. RADIATION DOSE REDUCTION: This exam was performed according to the departmental dose-optimization program which includes automated exposure control, adjustment of the mA and/or kV according to patient size and/or use of iterative reconstruction technique. CONTRAST:  75mL OMNIPAQUE IOHEXOL 350 MG/ML SOLN COMPARISON:  None Available. FINDINGS: CTA NECK FINDINGS Aortic arch: Standard branching with moderate atherosclerotic calcification. Patent  brachiocephalic and subclavian arteries without evidence of a significant stenosis. Right carotid system: Patent with a small amount of scattered plaque in the common carotid artery and a moderate amount of calcified plaque in the carotid bulb. No evidence of a significant stenosis or dissection. Left carotid system: Patent with mild scattered atherosclerotic plaque in the common carotid artery and at the carotid bifurcation. No evidence of a significant stenosis or dissection. Vertebral arteries: Patent and codominant with calcified plaque at both vertebral artery origins resulting in at most mild stenosis. No evidence of a dissection. Skeleton: Cervical spine reported separately. No destructive process. Other neck: No evidence of cervical lymphadenopathy or mass. Upper chest: No mass or consolidation in the included lung apices. Review of the MIP images confirms the above findings CTA HEAD FINDINGS Anterior circulation: The internal carotid arteries are patent from skull base to carotid termini with mild atherosclerosis not resulting in significant stenosis. ACAs and MCAs are patent without evidence of a proximal branch occlusion or significant proximal stenosis. No aneurysm is identified. Posterior circulation: The intracranial vertebral arteries are patent with calcified plaque resulting in mild stenosis bilaterally. Patent PICA, AICA, and SCA origins are visualized bilaterally. The basilar artery is widely patent. Posterior communicating arteries are diminutive or absent. Both PCAs are patent without evidence of a significant proximal stenosis. No aneurysm is identified. Venous sinuses: Patent. Anatomic variants: None. Review of the MIP images confirms the above findings IMPRESSION: 1. Mild atherosclerosis in the head and neck without a large vessel occlusion or flow limiting proximal stenosis. 2.  Aortic Atherosclerosis (ICD10-I70.0). Electronically Signed   By: Dasie Hamburg M.D.   On: 06/21/2024 16:10   DG  Chest 1 View Result Date: 06/21/2024 EXAM: 1 VIEW(S) XRAY OF THE CHEST 06/21/2024 02:46:00 PM COMPARISON: Comparison 04/13/2020 stable hiatal hernia. CLINICAL HISTORY: Nstemi. Nstemi, HA x Monday, tingling in right fingers, HTN, nonsmoker. FINDINGS: LUNGS AND PLEURA: No focal pulmonary opacity. No pulmonary edema. No pleural effusion. No pneumothorax. HEART AND MEDIASTINUM: No acute abnormality of the cardiac and mediastinal silhouettes. Stable hiatal hernia. BONES AND SOFT TISSUES: No acute osseous abnormality. IMPRESSION: 1. No acute cardiopulmonary process 2. Stable hiatal hernia Electronically signed by: Lynwood Seip MD 06/21/2024 02:55 PM EDT RP Workstation: [id number]   MR BRAIN WO CONTRAST Result Date: 06/21/2024 CLINICAL DATA:  Right hand numbness, headache, and hypertension. EXAM: MRI HEAD WITHOUT CONTRAST TECHNIQUE: Multiplanar, multiecho pulse sequences of the brain and surrounding structures were obtained without intravenous contrast. COMPARISON:  Head CT 06/21/2024 FINDINGS: The study is intermittently up to moderately motion degraded. Brain: There are punctate acute infarcts in the right middle cerebellar peduncle and more inferiorly in the right cerebellar hemisphere. Small acute supratentorial infarcts involve the splenium of the corpus callosum on the left and left parietal and posterior left frontal cortex and subcortical white matter. The largest infarct is that in the corpus callosum and measures 7 mm. No intracranial hemorrhage, mass, midline shift, or extra-axial fluid collection is identified. There is mild-to-moderate cerebral atrophy. T2 hyperintensities in the cerebral white matter and pons are nonspecific but compatible with mild chronic small  vessel ischemic disease. 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 Vascular: Major intracranial vascular flow voids are preserved. Skull and upper cervical spine: Unremarkable bone marrow  signal. Sinuses/Orbits: Unremarkable orbits. Paranasal sinuses and mastoid air cells are clear. Other: None. IMPRESSION: 1. Small acute supratentorial and infratentorial infarcts suggestive of emboli. 2. Mild chronic small vessel ischemic disease. Electronically Signed   By: Dasie Hamburg M.D.   On: 06/21/2024 13:21   CT HEAD WO CONTRAST Result Date: 06/21/2024 EXAM: CT HEAD WITHOUT CONTRAST 06/21/2024 10:59:34 AM TECHNIQUE: CT of the head was performed without the administration of intravenous contrast. Automated exposure control, iterative reconstruction, and/or weight based adjustment of the mA/kV was utilized to reduce the radiation dose to as low as reasonably achievable. COMPARISON: None available. CLINICAL HISTORY: Neuro deficit, acute, stroke suspected. Patient bib [facility] from work with complaints of headache, hypertension, tingling in the right hand that comes and goes. EMS reports neg stroke screen. PMH: migraines with visual spots. FINDINGS: BRAIN AND VENTRICLES: No acute hemorrhage. No evidence of acute infarct. Nonspecific hypoattenuation in the periventricular and subcortical white matter, most likely representing chronic microvascular ischemic changes. Mild generalized parenchymal volume loss. There is a remote infarct in the periventricular white matter adjacent to the right frontal horn partially involving the genu of the corpus callosum. Small remote lacunar infarct in the left thalamus. Mineralization in the basal ganglia and bilateral cerebellum. No hydrocephalus. No extra-axial collection. No mass effect or midline shift. ORBITS: No acute abnormality. SINUSES: No acute abnormality. SOFT TISSUES AND SKULL: No acute soft tissue abnormality. No skull fracture. VASCULATURE: Atherosclerosis of the carotid siphons and intracranial vertebral arteries. IMPRESSION: 1. No acute intracranial abnormality. 2. 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 3. Chronic  microvascular ischemic changes and mild volume loss. Electronically signed by: Donnice Mania MD 06/21/2024 11:14 AM EDT RP Workstation: [id number]    Scheduled Meds:   stroke: early stages of recovery book   Does not apply Once   atorvastatin  40 mg Oral Daily   clopidogrel  75 mg Oral Daily   enoxaparin  (LOVENOX ) injection  40 mg Subcutaneous Q24H   gabapentin   300 mg Oral BID   Continuous Infusions:  sodium chloride  50 mL/hr at 06/22/24 0201     LOS: 1 day   Ivonne Mustache, MD [facility] P10/16/2025, 8:08 AM

## 2024-06-22 NOTE — Plan of Care (Signed)
 Problem: Education: Goal: Knowledge of disease or condition will improve 06/22/2024 0343 by Jenness Dorn ORN, RN Outcome: Progressing 06/21/2024 2303 by Jenness Dorn ORN, RN Outcome: Progressing Goal: Knowledge of secondary prevention will improve (MUST DOCUMENT ALL) 06/22/2024 0343 by Jenness Dorn ORN, RN Outcome: Progressing 06/21/2024 2303 by Jenness Dorn ORN, RN Outcome: Progressing Goal: Knowledge of patient specific risk factors will improve (DELETE if not current risk factor) 06/22/2024 0343 by Jenness Dorn ORN, RN Outcome: Progressing 06/21/2024 2303 by Jenness Dorn ORN, RN Outcome: Progressing   Problem: Ischemic Stroke/TIA Tissue Perfusion: Goal: Complications of ischemic stroke/TIA will be minimized 06/22/2024 0343 by Jenness Dorn ORN, RN Outcome: Progressing 06/21/2024 2303 by Jenness Dorn ORN, RN Outcome: Progressing   Problem: Coping: Goal: Will verbalize positive feelings about self 06/22/2024 0343 by Jenness Dorn ORN, RN Outcome: Progressing 06/21/2024 2303 by Jenness Dorn ORN, RN Outcome: Progressing Goal: Will identify appropriate support needs 06/22/2024 0343 by Jenness Dorn ORN, RN Outcome: Progressing 06/21/2024 2303 by Jenness Dorn ORN, RN Outcome: Progressing   Problem: Health Behavior/Discharge Planning: Goal: Ability to manage health-related needs will improve 06/22/2024 0343 by Jenness Dorn ORN, RN Outcome: Progressing 06/21/2024 2303 by Jenness Dorn ORN, RN Outcome: Progressing Goal: Goals will be collaboratively established with patient/family 06/22/2024 0343 by Jenness Dorn ORN, RN Outcome: Progressing 06/21/2024 2303 by Jenness Dorn ORN, RN Outcome: Progressing   Problem: Self-Care: Goal: Ability to participate in self-care as condition permits will improve 06/22/2024 0343 by Jenness Dorn ORN, RN Outcome: Progressing 06/21/2024 2303 by Jenness Dorn ORN, RN Outcome: Progressing Goal: Verbalization of feelings and concerns over  difficulty with self-care will improve 06/22/2024 0343 by Jenness Dorn ORN, RN Outcome: Progressing 06/21/2024 2303 by Jenness Dorn ORN, RN Outcome: Progressing Goal: Ability to communicate needs accurately will improve 06/22/2024 0343 by Jenness Dorn ORN, RN Outcome: Progressing 06/21/2024 2303 by Jenness Dorn ORN, RN Outcome: Progressing   Problem: Nutrition: Goal: Risk of aspiration will decrease 06/22/2024 0343 by Jenness Dorn ORN, RN Outcome: Progressing 06/21/2024 2303 by Jenness Dorn ORN, RN Outcome: Progressing Goal: Dietary intake will improve 06/22/2024 0343 by Jenness Dorn ORN, RN Outcome: Progressing 06/21/2024 2303 by Jenness Dorn ORN, RN Outcome: Progressing   Problem: Education: Goal: Knowledge of General Education information will improve Description: Including pain rating scale, medication(s)/side effects and non-pharmacologic comfort measures 06/22/2024 0343 by Jenness Dorn ORN, RN Outcome: Progressing 06/21/2024 2303 by Jenness Dorn ORN, RN Outcome: Progressing   Problem: Health Behavior/Discharge Planning: Goal: Ability to manage health-related needs will improve 06/22/2024 0343 by Jenness Dorn ORN, RN Outcome: Progressing 06/21/2024 2303 by Jenness Dorn ORN, RN Outcome: Progressing   Problem: Clinical Measurements: Goal: Ability to maintain clinical measurements within normal limits will improve 06/22/2024 0343 by Jenness Dorn ORN, RN Outcome: Progressing 06/21/2024 2303 by Jenness Dorn ORN, RN Outcome: Progressing Goal: Will remain free from infection 06/22/2024 0343 by Jenness Dorn ORN, RN Outcome: Progressing 06/21/2024 2303 by Jenness Dorn ORN, RN Outcome: Progressing Goal: Diagnostic test results will improve 06/22/2024 0343 by Jenness Dorn ORN, RN Outcome: Progressing 06/21/2024 2303 by Jenness Dorn ORN, RN Outcome: Progressing Goal: Respiratory complications will improve 06/22/2024 0343 by Jenness Dorn ORN, RN Outcome:  Progressing 06/21/2024 2303 by Jenness Dorn ORN, RN Outcome: Progressing Goal: Cardiovascular complication will be avoided 06/22/2024 0343 by Jenness Dorn ORN, RN Outcome: Progressing 06/21/2024 2303 by Jenness Dorn ORN, RN Outcome: Progressing   Problem: Activity: Goal: Risk for activity intolerance will decrease 06/22/2024 0343 by Jenness Dorn ORN, RN Outcome: Progressing 06/21/2024 2303 by  Jenness Dorn ORN, RN Outcome: Progressing   Problem: Nutrition: Goal: Adequate nutrition will be maintained 06/22/2024 0343 by Jenness Dorn ORN, RN Outcome: Progressing 06/21/2024 2303 by Jenness Dorn ORN, RN Outcome: Progressing   Problem: Coping: Goal: Level of anxiety will decrease 06/22/2024 0343 by Jenness Dorn ORN, RN Outcome: Progressing 06/21/2024 2303 by Jenness Dorn ORN, RN Outcome: Progressing   Problem: Elimination: Goal: Will not experience complications related to bowel motility 06/22/2024 0343 by Jenness Dorn ORN, RN Outcome: Progressing 06/21/2024 2303 by Jenness Dorn ORN, RN Outcome: Progressing Goal: Will not experience complications related to urinary retention 06/22/2024 0343 by Jenness Dorn ORN, RN Outcome: Progressing 06/21/2024 2303 by Jenness Dorn ORN, RN Outcome: Progressing   Problem: Pain Managment: Goal: General experience of comfort will improve and/or be controlled 06/22/2024 0343 by Jenness Dorn ORN, RN Outcome: Progressing 06/21/2024 2303 by Jenness Dorn ORN, RN Outcome: Progressing   Problem: Safety: Goal: Ability to remain free from injury will improve 06/22/2024 0343 by Jenness Dorn ORN, RN Outcome: Progressing 06/21/2024 2303 by Jenness Dorn ORN, RN Outcome: Progressing   Problem: Skin Integrity: Goal: Risk for impaired skin integrity will decrease 06/22/2024 0343 by Jenness Dorn ORN, RN Outcome: Progressing 06/21/2024 2303 by Jenness Dorn ORN, RN Outcome: Progressing

## 2024-06-22 NOTE — TOC CAGE-AID Note (Signed)
 Transition of Care [facility]) - CAGE-AID Screening   Patient Details  Name: Kendra [name] MRN: [id number] Date of Birth: 09-06-1951  Transition of [facility]) CM/SW Contact:    Octaviano Mukai E Donice Alperin, LCSW Phone Number: 06/22/2024, 9:47 AM   Clinical Narrative: No SA noted.   CAGE-AID Screening:    Have You Ever Felt You Ought to Cut Down on Your Drinking or Drug Use?: No Have People Annoyed You By Critizing Your Drinking Or Drug Use?: No Have You Felt Bad Or Guilty About Your Drinking Or Drug Use?: No Have You Ever Had a Drink or Used Drugs First Thing In The Morning to Steady Your Nerves or to Get Rid of a Hangover?: No CAGE-AID Score: 0  Substance Abuse Education Offered: No
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
## 2024-06-22 NOTE — Care Management Obs Status (Signed)
 MEDICARE OBSERVATION STATUS NOTIFICATION   Patient Details  Name: Kendra [name] MRN: [id number] Date of Birth: Dec 24, 1950   Medicare Observation Status Notification Given:  Yes    Andrez JULIANNA George, RN 06/22/2024, 2:46 PM

## 2024-06-22 NOTE — Care Management CC44 (Signed)
 Condition Code 44 Documentation Completed  Patient Details  Name: Kendra [name] MRN: [id number] Date of Birth: Jun 30, 1951   Condition Code 44 given:  Yes Patient signature on Condition Code 44 notice:  Yes Documentation of 2 MD's agreement:  Yes Code 44 added to claim:  Yes    Andrez JULIANNA George, RN 06/22/2024, 2:46 PM

## 2024-06-23 ENCOUNTER — Encounter (HOSPITAL_COMMUNITY)
Admission: EM | Disposition: A | Discharge status: Home / Self Care | Payer: Self-pay | Source: Home / Self Care | Attending: Emergency Medicine

## 2024-06-23 ENCOUNTER — Other Ambulatory Visit (HOSPITAL_COMMUNITY): Payer: Self-pay

## 2024-06-23 DIAGNOSIS — E785 Hyperlipidemia, unspecified: Secondary | ICD-10-CM | POA: Diagnosis not present

## 2024-06-23 DIAGNOSIS — I6340 Cerebral infarction due to embolism of unspecified cerebral artery: Secondary | ICD-10-CM | POA: Diagnosis not present

## 2024-06-23 DIAGNOSIS — I639 Cerebral infarction, unspecified: Secondary | ICD-10-CM | POA: Diagnosis not present

## 2024-06-23 HISTORY — PX: LOOP RECORDER INSERTION: EP1214

## 2024-06-23 LAB — LIPOPROTEIN A (LPA): Lipoprotein (a): 10.2 nmol/L (ref <75.0)

## 2024-06-23 SURGERY — LOOP RECORDER INSERTION

## 2024-06-23 MED ORDER — CLOPIDOGREL BISULFATE 75 MG PO TABS
75.0000 mg | ORAL_TABLET | Freq: Every day | ORAL | 0 refills | Status: DC
  Filled 2024-06-23: qty 60, 60d supply, fill #0

## 2024-06-23 MED ORDER — ONDANSETRON HCL 4 MG PO TABS
4.0000 mg | ORAL_TABLET | Freq: Every day | ORAL | 0 refills | Status: AC | PRN
  Filled 2024-06-23: qty 20, 20d supply, fill #0

## 2024-06-23 MED ORDER — ONDANSETRON HCL 4 MG/2ML IJ SOLN
4.0000 mg | Freq: Four times a day (QID) | INTRAMUSCULAR | Status: DC | PRN
  Administered 2024-06-23: 4 mg via INTRAVENOUS
  Filled 2024-06-23: qty 2

## 2024-06-23 MED ORDER — BUTALBITAL-APAP-CAFFEINE 50-325-40 MG PO TABS
1.0000 | ORAL_TABLET | ORAL | 0 refills | Status: AC | PRN
  Filled 2024-06-23: qty 14, 3d supply, fill #0

## 2024-06-23 MED ORDER — IRBESARTAN 75 MG PO TABS
75.0000 mg | ORAL_TABLET | Freq: Every day | ORAL | 0 refills | Status: AC
  Filled 2024-06-23: qty 30, 30d supply, fill #0

## 2024-06-23 MED ORDER — LIDOCAINE-EPINEPHRINE 1 %-1:100000 IJ SOLN
INTRAMUSCULAR | Status: DC | PRN
  Administered 2024-06-23: 10 mL via INTRADERMAL

## 2024-06-23 MED ORDER — IRBESARTAN 150 MG PO TABS
75.0000 mg | ORAL_TABLET | Freq: Every day | ORAL | Status: DC
  Administered 2024-06-23: 75 mg via ORAL
  Filled 2024-06-23: qty 1

## 2024-06-23 MED ORDER — ATORVASTATIN CALCIUM 40 MG PO TABS
40.0000 mg | ORAL_TABLET | Freq: Every day | ORAL | 0 refills | Status: AC
  Filled 2024-06-23: qty 60, 60d supply, fill #0

## 2024-06-23 MED ORDER — AMLODIPINE BESYLATE 5 MG PO TABS
5.0000 mg | ORAL_TABLET | Freq: Every day | ORAL | 0 refills | Status: AC
  Filled 2024-06-23: qty 30, 30d supply, fill #0

## 2024-06-23 MED ORDER — BUTALBITAL-APAP-CAFFEINE 50-325-40 MG PO TABS
1.0000 | ORAL_TABLET | ORAL | Status: DC | PRN
  Administered 2024-06-23: 1 via ORAL
  Filled 2024-06-23: qty 1

## 2024-06-23 MED ORDER — LIDOCAINE-EPINEPHRINE 1 %-1:100000 IJ SOLN
INTRAMUSCULAR | Status: AC
  Filled 2024-06-23: qty 1

## 2024-06-23 SURGICAL SUPPLY — 2 items
MONITOR REVEAL LINQ II (Prosthesis & Implant Heart) IMPLANT
PACK LOOP INSERTION (CUSTOM PROCEDURE TRAY) ×1 IMPLANT

## 2024-06-23 NOTE — Progress Notes (Signed)
 STROKE TEAM PROGRESS NOTE   SUBJECTIVE (INTERVAL HISTORY) Her son is at the bedside.  Pt stated that she had bad headache this morning, feeling nausea, but no vomiting. S/p tylenol  and fioricet as well as zofran . She had loop recorder placed. HA improved later.    OBJECTIVE Temp:  [97.8 F (36.6 C)-99.1 F (37.3 C)] 97.8 F (36.6 C) (10/17 1311) Pulse Rate:  [74-90] 83 (10/17 1311) Cardiac Rhythm: Normal sinus rhythm (10/17 0738) Resp:  [16-19] 17 (10/17 1311) BP: (118-163)/(56-89) 133/56 (10/17 1311) SpO2:  [94 %-98 %] 97 % (10/17 1311)  Recent Labs  Lab 06/21/24 1133  GLUCAP 97   Recent Labs  Lab 06/21/24 1049  NA 142  K 3.8  CL 103  CO2 24  GLUCOSE 97  BUN 11  CREATININE 0.74  CALCIUM 9.0   Recent Labs  Lab 06/21/24 1049  AST 27  ALT 17  ALKPHOS 90  BILITOT 0.5  PROT 7.0  ALBUMIN 3.9   Recent Labs  Lab 06/21/24 1049  WBC 8.3  NEUTROABS 5.6  HGB 14.1  HCT 44.5  MCV 96.5  PLT 276   No results for input(s): CKTOTAL, CKMB, CKMBINDEX, TROPONINI in the last 168 hours. Recent Labs    06/21/24 1359  LABPROT 13.4  INR 1.0   No results for input(s): COLORURINE, LABSPEC, PHURINE, GLUCOSEU, HGBUR, BILIRUBINUR, KETONESUR, PROTEINUR, UROBILINOGEN, NITRITE, LEUKOCYTESUR in the last 72 hours.  Invalid input(s): APPERANCEUR     Component Value Date/Time   CHOL 275 (H) 06/21/2024 1843   TRIG 293 (H) 06/21/2024 1843   HDL 51 06/21/2024 1843   CHOLHDL 5.4 06/21/2024 1843   VLDL 59 (H) 06/21/2024 1843   LDLCALC 165 (H) 06/21/2024 1843   Lab Results  Component Value Date   HGBA1C 5.5 06/21/2024      Component Value Date/Time   LABOPIA NONE DETECTED 06/21/2024 1150   COCAINSCRNUR NONE DETECTED 06/21/2024 1150   LABBENZ NONE DETECTED 06/21/2024 1150   AMPHETMU NONE DETECTED 06/21/2024 1150   THCU NONE DETECTED 06/21/2024 1150   LABBARB NONE DETECTED 06/21/2024 1150    Recent Labs  Lab 06/21/24 1049  ETH <15    I  have personally reviewed the radiological images below and agree with the radiology interpretations.  EEG adult Result Date: 06/22/2024 Shelton Arlin KIDD, MD     06/22/2024  5:44 PM Patient Name: Kendra [name] MRN: [id number] Epilepsy Attending: Arlin KIDD Shelton Referring Physician/Provider: Jerri Pfeiffer, MD Date: 06/22/2024 Duration: 23.20 mins Patient history: 73yo f with episodic visual disturbance.. EEG to evaluate for seizure Level of alertness: Awake, asleep AEDs during EEG study: GBP Technical aspects: This EEG study was done with scalp electrodes positioned according to the 10-20 International system of electrode placement. Electrical activity was reviewed with band pass filter of 1-70Hz , sensitivity of 7 uV/mm, display speed of 31mm/sec with a 60Hz  notched filter applied as appropriate. EEG data were recorded continuously and digitally stored.  Video monitoring was available and reviewed as appropriate. Description: The posterior dominant rhythm consists of 8 Hz activity of moderate voltage (25-35 uV) seen predominantly in posterior head regions, symmetric and reactive to eye opening and eye closing. Sleep was characterized by vertex waves, sleep spindles (12 to 14 Hz), maximal frontocentral region.  Hyperventilation and photic stimulation were not performed.   IMPRESSION: This study is within normal limits. No seizures or epileptiform discharges were seen throughout the recording. A normal interictal EEG does not exclude the diagnosis of epilepsy. Kendra [name]  VAS US  LOWER EXTREMITY VENOUS (DVT) Result Date: 06/22/2024  Lower Venous DVT Study Patient Name:  Kendra [name]  Date of Exam:   06/22/2024 Medical Rec #: [id number]         Accession #:    [id number] Date of Birth: February 19, 1951         Patient Gender: F Patient Age:   3 years Exam Location:  [facility] Procedure:      VAS US  LOWER EXTREMITY VENOUS (DVT) Referring Phys: ARY Marcel Gary  --------------------------------------------------------------------------------  Indications: Stroke.  Limitations: Poor ultrasound/tissue interface. Comparison Study: No previous exams Performing Technologist: Jody Hill RVT, RDMS  Examination Guidelines: A complete evaluation includes B-mode imaging, spectral Doppler, color Doppler, and power Doppler as needed of all accessible portions of each vessel. Bilateral testing is considered an integral part of a complete examination. Limited examinations for reoccurring indications may be performed as noted. The reflux portion of the exam is performed with the patient in reverse Trendelenburg.  +---------+---------------+---------+-----------+----------+--------------+ RIGHT    CompressibilityPhasicitySpontaneityPropertiesThrombus Aging +---------+---------------+---------+-----------+----------+--------------+ CFV      Full           Yes      Yes                                 +---------+---------------+---------+-----------+----------+--------------+ SFJ      Full                                                        +---------+---------------+---------+-----------+----------+--------------+ FV Prox  Full           Yes      Yes                                 +---------+---------------+---------+-----------+----------+--------------+ FV Mid   Full           Yes      Yes                                 +---------+---------------+---------+-----------+----------+--------------+ FV DistalFull           Yes      Yes                                 +---------+---------------+---------+-----------+----------+--------------+ PFV      Full                                                        +---------+---------------+---------+-----------+----------+--------------+ POP      Full           Yes      Yes                                 +---------+---------------+---------+-----------+----------+--------------+ PTV       Full                                                        +---------+---------------+---------+-----------+----------+--------------+  PERO     Full                                                        +---------+---------------+---------+-----------+----------+--------------+   +---------+---------------+---------+-----------+----------+--------------+ LEFT     CompressibilityPhasicitySpontaneityPropertiesThrombus Aging +---------+---------------+---------+-----------+----------+--------------+ CFV      Full           Yes      Yes                                 +---------+---------------+---------+-----------+----------+--------------+ SFJ      Full                                                        +---------+---------------+---------+-----------+----------+--------------+ FV Prox  Full           Yes      Yes                                 +---------+---------------+---------+-----------+----------+--------------+ FV Mid   Full           Yes      Yes                                 +---------+---------------+---------+-----------+----------+--------------+ FV DistalFull           Yes      Yes                                 +---------+---------------+---------+-----------+----------+--------------+ PFV      Full                                                        +---------+---------------+---------+-----------+----------+--------------+ POP      Full           Yes      Yes                                 +---------+---------------+---------+-----------+----------+--------------+ PTV      Full                                                        +---------+---------------+---------+-----------+----------+--------------+ PERO     Full                                                        +---------+---------------+---------+-----------+----------+--------------+  Summary: BILATERAL: - No evidence of deep vein  thrombosis seen in the lower extremities, bilaterally. -No evidence of popliteal cyst, bilaterally.   *See table(s) above for measurements and observations. Electronically signed by Debby Robertson on 06/22/2024 at 5:15:03 PM.    Final    ECHOCARDIOGRAM COMPLETE Result Date: 06/22/2024    ECHOCARDIOGRAM REPORT   Patient Name:   AMELLIA PANIK Date of Exam: 06/22/2024 Medical Rec #:  [id number]        Height:       65.0 in Accession #:    [id number]       Weight:       230.0 lb Date of Birth:  1950/12/15        BSA:          2.099 m Patient Age:    73 years         BP:           168/76 mmHg Patient Gender: F                HR:           80 bpm. Exam Location:  Inpatient Procedure: 2D Echo, Cardiac Doppler and Color Doppler (Both Spectral and Color            Flow Doppler were utilized during procedure). Indications:    Stroke I63.9  History:        Patient has no prior history of Echocardiogram examinations.                 CHF, CAD, Stroke and COPD; Risk Factors:Hypertension, Diabetes,                 Sleep Apnea and Dyslipidemia.  Sonographer:    Koleen Popper RDCS Referring Phys: [id number] RONDELL A SMITH  Sonographer Comments: Image acquisition challenging due to respiratory motion. IMPRESSIONS  1. Left ventricular ejection fraction, by estimation, is 60 to 65%. The left ventricle has normal function. The left ventricle has no regional wall motion abnormalities. There is mild asymmetric left ventricular hypertrophy of the septal segment. Left ventricular diastolic parameters were normal.  2. Right ventricular systolic function is normal. The right ventricular size is mildly enlarged.  3. The mitral valve is normal in structure. No evidence of mitral valve regurgitation. No evidence of mitral stenosis.  4. The aortic valve is normal in structure. Aortic valve regurgitation is not visualized. No aortic stenosis is present.  5. The inferior vena cava is normal in size with greater than 50% respiratory variability,  suggesting right atrial pressure of 3 mmHg. FINDINGS  Left Ventricle: Left ventricular ejection fraction, by estimation, is 60 to 65%. The left ventricle has normal function. The left ventricle has no regional wall motion abnormalities. The left ventricular internal cavity size was normal in size. There is  mild asymmetric left ventricular hypertrophy of the septal segment. Left ventricular diastolic parameters were normal. Right Ventricle: The right ventricular size is mildly enlarged. No increase in right ventricular wall thickness. Right ventricular systolic function is normal. Left Atrium: Left atrial size was normal in size. Right Atrium: Right atrial size was normal in size. Pericardium: There is no evidence of pericardial effusion. Presence of epicardial fat layer. Mitral Valve: The mitral valve is normal in structure. No evidence of mitral valve regurgitation. No evidence of mitral valve stenosis. MV peak gradient, 8.5 mmHg. The mean mitral valve gradient is 2.0 mmHg. Tricuspid Valve: The tricuspid valve is normal in structure. Tricuspid valve regurgitation  is not demonstrated. No evidence of tricuspid stenosis. Aortic Valve: The aortic valve is normal in structure. Aortic valve regurgitation is not visualized. No aortic stenosis is present. Pulmonic Valve: The pulmonic valve was normal in structure. Pulmonic valve regurgitation is not visualized. No evidence of pulmonic stenosis. Aorta: The aortic root is normal in size and structure. Venous: The inferior vena cava is normal in size with greater than 50% respiratory variability, suggesting right atrial pressure of 3 mmHg. IAS/Shunts: No atrial level shunt detected by color flow Doppler.  LEFT VENTRICLE PLAX 2D LVIDd:         4.04 cm   Diastology LVIDs:         2.59 cm   LV e' medial:    7.62 cm/s LV PW:         0.98 cm   LV E/e' medial:  12.5 LV IVS:        1.11 cm   LV e' lateral:   5.98 cm/s LVOT diam:     1.79 cm   LV E/e' lateral: 15.9 LV SV:         50  LV SV Index:   24 LVOT Area:     2.52 cm  RIGHT VENTRICLE             IVC RV Basal diam:  4.38 cm     IVC diam: 1.33 cm RV Mid diam:    3.49 cm RV S prime:     12.40 cm/s TAPSE (M-mode): 2.7 cm LEFT ATRIUM             Index        RIGHT ATRIUM           Index LA diam:        3.31 cm 1.58 cm/m   RA Area:     11.60 cm LA Vol (A2C):   41.7 ml 19.86 ml/m  RA Volume:   24.50 ml  11.67 ml/m LA Vol (A4C):   40.8 ml 19.44 ml/m LA Biplane Vol: 43.8 ml 20.87 ml/m  AORTIC VALVE LVOT Vmax:   92.00 cm/s LVOT Vmean:  61.600 cm/s LVOT VTI:    0.198 m  AORTA Ao Root diam: 2.68 cm Ao Asc diam:  2.72 cm MITRAL VALVE MV Area (PHT): 4.15 cm     SHUNTS MV Area VTI:   1.26 cm     Systemic VTI:  0.20 m MV Peak grad:  8.5 mmHg     Systemic Diam: 1.79 cm MV Mean grad:  2.0 mmHg MV Vmax:       1.46 m/s MV Vmean:      62.9 cm/s MV Decel Time: 183 msec MV E velocity: 95.30 cm/s MV A velocity: 136.00 cm/s MV E/A ratio:  0.70 Kardie Tobb DO Electronically signed by Dub Huntsman DO Signature Date/Time: 06/22/2024/2:31:17 PM    Final    CT C-SPINE NO CHARGE Result Date: 06/21/2024 CLINICAL DATA:  Neuro deficit, acute, stroke suspected. Numbness in the fourth and fifth digits of the right hand. EXAM: CT CERVICAL SPINE WITH CONTRAST TECHNIQUE: Multiplanar CT images of the cervical spine were reconstructed from contemporary CTA of the Neck. RADIATION DOSE REDUCTION: This exam was performed according to the departmental dose-optimization program which includes automated exposure control, adjustment of the mA and/or kV according to patient size and/or use of iterative reconstruction technique. CONTRAST:  No additional. COMPARISON:  None Available. FINDINGS: Alignment: Reversal of the normal cervical lordosis. Trace anterolisthesis of C7 on T1. Skull base  and vertebrae: No acute fracture or suspicious lesion. Soft tissues and spinal canal: No prevertebral fluid or swelling. No visible canal hematoma. Disc levels: Moderate disc space  narrowing and degenerative endplate changes at C4-5, C5-6, and C7-T1. Asymmetrically advanced facet arthrosis on the left at C2-3 and on the right at C3-4. Mild spinal stenosis at C4-5 and C5-6. Moderate left neural foraminal stenosis at C5-6 and no more than mild apparent neural foraminal stenosis elsewhere. No evidence of high-grade right-sided neural foraminal stenosis in the lower cervical spine to correlate with the patient's right hand numbness. Upper chest: No mass or consolidation in the included lung apices. Other: CTA reported separately. IMPRESSION: 1. No acute osseous abnormality. 2. Cervical disc and facet degeneration with mild spinal stenosis and moderate left neural foraminal stenosis at C5-6. Electronically Signed   By: Dasie Hamburg M.D.   On: 06/21/2024 16:40   CT ANGIO HEAD NECK W WO CM Result Date: 06/21/2024 CLINICAL DATA:  Neuro deficit, acute, stroke suspected. Right hand tingling, headache, and hypertension. EXAM: CT ANGIOGRAPHY HEAD AND NECK WITH AND WITHOUT CONTRAST TECHNIQUE: Multidetector CT imaging of the head and neck was performed using the standard protocol during bolus administration of intravenous contrast. Multiplanar CT image reconstructions and MIPs were obtained to evaluate the vascular anatomy. Carotid stenosis measurements (when applicable) are obtained utilizing NASCET criteria, using the distal internal carotid diameter as the denominator. RADIATION DOSE REDUCTION: This exam was performed according to the departmental dose-optimization program which includes automated exposure control, adjustment of the mA and/or kV according to patient size and/or use of iterative reconstruction technique. CONTRAST:  75mL OMNIPAQUE IOHEXOL 350 MG/ML SOLN COMPARISON:  None Available. FINDINGS: CTA NECK FINDINGS Aortic arch: Standard branching with moderate atherosclerotic calcification. Patent brachiocephalic and subclavian arteries without evidence of a significant stenosis. Right  carotid system: Patent with a small amount of scattered plaque in the common carotid artery and a moderate amount of calcified plaque in the carotid bulb. No evidence of a significant stenosis or dissection. Left carotid system: Patent with mild scattered atherosclerotic plaque in the common carotid artery and at the carotid bifurcation. No evidence of a significant stenosis or dissection. Vertebral arteries: Patent and codominant with calcified plaque at both vertebral artery origins resulting in at most mild stenosis. No evidence of a dissection. Skeleton: Cervical spine reported separately. No destructive process. Other neck: No evidence of cervical lymphadenopathy or mass. Upper chest: No mass or consolidation in the included lung apices. Review of the MIP images confirms the above findings CTA HEAD FINDINGS Anterior circulation: The internal carotid arteries are patent from skull base to carotid termini with mild atherosclerosis not resulting in significant stenosis. ACAs and MCAs are patent without evidence of a proximal branch occlusion or significant proximal stenosis. No aneurysm is identified. Posterior circulation: The intracranial vertebral arteries are patent with calcified plaque resulting in mild stenosis bilaterally. Patent PICA, AICA, and SCA origins are visualized bilaterally. The basilar artery is widely patent. Posterior communicating arteries are diminutive or absent. Both PCAs are patent without evidence of a significant proximal stenosis. No aneurysm is identified. Venous sinuses: Patent. Anatomic variants: None. Review of the MIP images confirms the above findings IMPRESSION: 1. Mild atherosclerosis in the head and neck without a large vessel occlusion or flow limiting proximal stenosis. 2.  Aortic Atherosclerosis (ICD10-I70.0). Electronically Signed   By: Dasie Hamburg M.D.   On: 06/21/2024 16:10   DG Chest 1 View Result Date: 06/21/2024 EXAM: 1 VIEW(S) XRAY OF THE CHEST  06/21/2024  02:46:00 PM COMPARISON: Comparison 04/13/2020 stable hiatal hernia. CLINICAL HISTORY: Nstemi. Nstemi, HA x Monday, tingling in right fingers, HTN, nonsmoker. FINDINGS: LUNGS AND PLEURA: No focal pulmonary opacity. No pulmonary edema. No pleural effusion. No pneumothorax. HEART AND MEDIASTINUM: No acute abnormality of the cardiac and mediastinal silhouettes. Stable hiatal hernia. BONES AND SOFT TISSUES: No acute osseous abnormality. IMPRESSION: 1. No acute cardiopulmonary process 2. Stable hiatal hernia Electronically signed by: Lynwood Seip MD 06/21/2024 02:55 PM EDT RP Workstation: [id number]   MR BRAIN WO CONTRAST Result Date: 06/21/2024 CLINICAL DATA:  Right hand numbness, headache, and hypertension. EXAM: MRI HEAD WITHOUT CONTRAST TECHNIQUE: Multiplanar, multiecho pulse sequences of the brain and surrounding structures were obtained without intravenous contrast. COMPARISON:  Head CT 06/21/2024 FINDINGS: The study is intermittently up to moderately motion degraded. Brain: There are punctate acute infarcts in the right middle cerebellar peduncle and more inferiorly in the right cerebellar hemisphere. Small acute supratentorial infarcts involve the splenium of the corpus callosum on the left and left parietal and posterior left frontal cortex and subcortical white matter. The largest infarct is that in the corpus callosum and measures 7 mm. No intracranial hemorrhage, mass, midline shift, or extra-axial fluid collection is identified. There is mild-to-moderate cerebral atrophy. T2 hyperintensities in the cerebral white matter and pons are nonspecific but compatible with mild chronic small vessel ischemic disease. 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 Vascular: Major intracranial vascular flow voids are preserved. Skull and upper cervical spine: Unremarkable bone marrow signal. Sinuses/Orbits: Unremarkable orbits. Paranasal sinuses and mastoid air cells are  clear. Other: None. IMPRESSION: 1. Small acute supratentorial and infratentorial infarcts suggestive of emboli. 2. Mild chronic small vessel ischemic disease. Electronically Signed   By: Dasie Hamburg M.D.   On: 06/21/2024 13:21   CT HEAD WO CONTRAST Result Date: 06/21/2024 EXAM: CT HEAD WITHOUT CONTRAST 06/21/2024 10:59:34 AM TECHNIQUE: CT of the head was performed without the administration of intravenous contrast. Automated exposure control, iterative reconstruction, and/or weight based adjustment of the mA/kV was utilized to reduce the radiation dose to as low as reasonably achievable. COMPARISON: None available. CLINICAL HISTORY: Neuro deficit, acute, stroke suspected. Patient bib [facility] from work with complaints of headache, hypertension, tingling in the right hand that comes and goes. EMS reports neg stroke screen. PMH: migraines with visual spots. FINDINGS: BRAIN AND VENTRICLES: No acute hemorrhage. No evidence of acute infarct. Nonspecific hypoattenuation in the periventricular and subcortical white matter, most likely representing chronic microvascular ischemic changes. Mild generalized parenchymal volume loss. There is a remote infarct in the periventricular white matter adjacent to the right frontal horn partially involving the genu of the corpus callosum. Small remote lacunar infarct in the left thalamus. Mineralization in the basal ganglia and bilateral cerebellum. No hydrocephalus. No extra-axial collection. No mass effect or midline shift. ORBITS: No acute abnormality. SINUSES: No acute abnormality. SOFT TISSUES AND SKULL: No acute soft tissue abnormality. No skull fracture. VASCULATURE: Atherosclerosis of the carotid siphons and intracranial vertebral arteries. IMPRESSION: 1. No acute intracranial abnormality. 2. 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 3. Chronic microvascular ischemic changes and mild volume loss. Electronically signed by: Donnice Mania  MD 06/21/2024 11:14 AM EDT RP Workstation: [id number]     PHYSICAL EXAM  Temp:  [97.8 F (36.6 C)-99.1 F (37.3 C)] 97.8 F (36.6 C) (10/17 1311) Pulse Rate:  [74-90] 83 (10/17 1311) Resp:  [16-19] 17 (10/17 1311)  BP: (118-163)/(56-89) 133/56 (10/17 1311) SpO2:  [94 %-98 %] 97 % (10/17 1311)  General - Well nourished, well developed, in no apparent distress.  Ophthalmologic - fundi not visualized due to noncooperation.  Cardiovascular - Regular rhythm and rate.  Mental Status -  Level of arousal and orientation to time, place, and person were intact. Language including expression, naming, repetition, comprehension was assessed and found intact. Fund of Knowledge was assessed and was intact.  Cranial Nerves II - XII - II - Visual field intact OU. Occasionally complaining of strange visual disturbance like people's hair fuzzy and lifting up, black spots on the wall or people's face, improving today but not resolved yet.  III, IV, VI - Extraocular movements intact. V - Facial sensation intact bilaterally. VII - Facial movement intact bilaterally. VIII - Hearing & vestibular intact bilaterally. X - Palate elevates symmetrically. XI - Chin turning & shoulder shrug intact bilaterally. XII - Tongue protrusion intact.  Motor Strength - The patient's strength was normal in all extremities and pronator drift was absent.  Bulk was normal and fasciculations were absent.   Motor Tone - Muscle tone was assessed at the neck and appendages and was normal.  Reflexes - The patient's reflexes were symmetrical in all extremities and she had no pathological reflexes.  Sensory - Light touch, temperature/pinprick were assessed and were symmetrical.    Coordination - The patient had normal movements in the hands and feet with no ataxia or dysmetria.  Tremor was absent.  Gait and Station - deferred.   ASSESSMENT/PLAN Ms. JAJAIRA RUIS is a 73 y.o. female with history of HTN, OSA,  fibromyalgia, migraine with visual aura admitted for R side HA and R finger numbness. No TNK given due to outside window.    Stroke:  multifocal bilateral small infarcts embolic pattern, concerning for cardioembolic source CT head no acute finding, old left thalamic, R periventricular WM infarcts. CTA head and neck unremarkable  MRI  Small acute supratentorial and infratentorial infarcts suggestive of emboli. 2D Echo EF 60-65% LE venous doppler no DVT Loop recorder placed today EEG normal, visual disturbance most likely due to L splenium infarct LDL 165, TG 293 HgbA1c 5.5 UDS neg Lovenox  for VTE prophylaxis No antithrombotic prior to admission, now on clopidogrel 75 mg daily. Pt has ASA allergy Patient counseled to be compliant with her antithrombotic medications Ongoing aggressive stroke risk factor management Therapy recommendations:  outpt PT Disposition:  none  Hypertension Stable on the high end Gradually normalize BP in 2-3 days Long term BP goal normotensive  Hyperlipidemia Home meds:  none  LDL 165, goal < 70 TG 293 Now on lipitor 40 Continue statin at discharge  Other Stroke Risk Factors Advanced age Obesity, Body mass index is 38.27 kg/m.  Obstructive sleep apnea, on CPAP at home  Other Active Problems Fibromyalgia  HA s/p tylenol  and fioricet - improving   Hospital day # 1  Neurology will sign off. Please call with questions. Pt will follow up with stroke clinic NP at [facility] in about 4 weeks. Thanks for the consult.   Ary Cummins, MD PhD Stroke Neurology 06/23/2024 2:32 PM    To contact Stroke Continuity provider, please refer to [email or web address]. After hours, contact General Neurology

## 2024-06-23 NOTE — Discharge Summary (Signed)
 Physician Discharge Summary  Kendra [name] FMW:[id number] DOB: 29-Aug-1951 DOA: 06/21/2024  PCP: Gladis Dannielle DEL, PA-C  Admit date: 06/21/2024 Discharge date: 06/23/2024  Admitted From: Home Disposition:  Home  Discharge Condition:Stable CODE STATUS:FULL Diet recommendation: Heart Healthy   Brief/Interim Summary: Patient is a 73 year old female with history of hypertension, fibromyalgia, sleep apnea, GERD, migraine headache who presented with complaint of headache, numbness in the right hand.  On presentation, she was hypertensive.  Lab work showed elevated troponin.  CT scan of the head did not show any acute findings but showed remote infarction in the right periventricular white matter and left thalamus.  MRI of the brain showed scattered infarcts :  punctate acute infarcts in the right middle cerebellar peduncle , right cerebellar hemisphere, small acute supratentorial infarcts involve the splenium of the corpus callosum on the left and left parietal and posterior left frontal cortex and subcortical white matter.Patient admitted for the stroke workup.  Stroke workup completed.  She underwent implantable loop recorder placement.  Medically stable for discharge home today.  Following problems were addressed during the hospitalization:  Acute CVA: Presented with right-sided headache, numbness on the right 4th and 5th fingers. CT scan of the head did not show any acute findings but showed remote infarction in the right periventricular white matter and left thalamus.  MRI of the brain showed  scattered infarcts :  punctate acute infarcts in the right middle cerebellar peduncle , right cerebellar hemisphere, small acute supratentorial infarcts involve the splenium of the corpus callosum on the left and left parietal and posterior left frontal cortex and subcortical white matter. Neurology consulted.  CTA head and neck did not show any large vessel occlusion.  LDL of 165, A1c of 5.5.  Lower  extremity Doppler negative for DVT, echocardiogram showed EF of 60 to 65%, no atrial level shunt.  PT/OT/speech consulted, recommended outpatient follow-up.  Allergic to aspirin so on Plavix only.  EEG is negative for seizures.  She will follow-up with neurology as an outpatient. Patient is ambulatory at baseline and works 3 times a week.   Elevated troponin: Denies chest pain.  Likely demand ischemia secondary to hypertensive emergency, stroke.  Echo did not show any regional wall motion abnormality.  Cardiology was following and recommended outpatient workup for ischemic evaluation   Hyperlipidemia: LDL of  165.   CT angiogram of the head and neck showed aortic atherosclerosis, minimal carotid plaques.  Continue Lipitor 40 mg daily   History of hypertension: Takes valsartan, amlodipine at home. as per report, she was not compliant with medications.  This medication will be continued   History of migraine headaches: Was having right-sided headache on presentation.  Given Tylenol , Benadryl , Reglan  in the ED.  He needs to follow-up with neurology as an outpatient for the management of chronic migraine   History of neuropathy/fibromyalgia: On gabapentin    GERD/hiatal hernia: Currently not on any medications   Obesity: BMI 38.2   Discharge Diagnoses:  Principal Problem:   CVA (cerebral vascular accident) (HCC) Active Problems:   Elevated troponin   Essential hypertension   Hyperlipidemia   History of migraine headaches   Neuropathy   Obesity, Class II, BMI 35-39.9   Hiatal hernia with GERD    Discharge Instructions  Discharge Instructions     Ambulatory referral to Neurology   Complete by: As directed    An appointment is requested in approximately: 4 weeks   Ambulatory referral to Occupational Therapy   Complete by: As directed  Ambulatory referral to Physical Therapy   Complete by: As directed    Ambulatory referral to Speech Therapy   Complete by: As directed    Diet -  low sodium heart healthy   Complete by: As directed    Discharge instructions   Complete by: As directed    1)Please take your medications as instructed 2)Follow up with your PCP next week 3)Follow up with neurology in 4 weeks.  Name and number of the provider group has been attached.   Increase activity slowly   Complete by: As directed       Allergies as of 06/23/2024       Reactions   Ibuprofen Other (See Comments)   Concerns w/ Stomach bleeds   Nsaids Other (See Comments)   GI Issues   Other Nausea And Vomiting   General anesthesia- CAN TOLERATE ONLY PROPOFOL    Codeine Nausea And Vomiting      Hydrocodone-acetaminophen  Nausea And Vomiting   Hydromorphone  Itching   Morphine  And Codeine Nausea And Vomiting, Other (See Comments)   Causes N/V after an extended period of time   Oxycodone  Other (See Comments)   Can tolerate a very low dose (with ginger ale and dry toast)   Propoxyphene Nausea And Vomiting   Tizanidine Other (See Comments)   made me feel weird   Tramadol Hcl Nausea And Vomiting, Other (See Comments)   Face got hot and vertigo- with 25 mg taken (too)   Prednisone Rash        Medication List     STOP taking these medications    enoxaparin  40 MG/0.4ML injection Commonly known as: LOVENOX    valsartan 160 MG tablet Commonly known as: DIOVAN       TAKE these medications    acetaminophen  325 MG tablet Commonly known as: TYLENOL  Take 500 mg by mouth every 6 (six) hours as needed for mild pain (pain score 1-3).   amLODipine 5 MG tablet Commonly known as: NORVASC Take 1 tablet (5 mg total) by mouth at bedtime.   ascorbic acid 500 MG tablet Commonly known as: VITAMIN C Take 500 mg by mouth in the morning.   atorvastatin 40 MG tablet Commonly known as: LIPITOR Take 1 tablet (40 mg total) by mouth daily. Start taking on: June 24, 2024 What changed:  medication strength how much to take when to take this   BACLOFEN PO Take 1 tablet by  mouth 2 (two) times daily as needed (for muscle spasms).   budesonide-formoterol 160-4.5 MCG/ACT inhaler Commonly known as: SYMBICORT Inhale 2 puffs into the lungs 2 (two) times daily as needed (for flares).   butalbital-acetaminophen -caffeine 50-325-40 MG tablet Commonly known as: FIORICET Take 1 tablet by mouth every 4 (four) hours as needed for headache or migraine.   clopidogrel 75 MG tablet Commonly known as: PLAVIX Take 1 tablet (75 mg total) by mouth daily. Start taking on: June 24, 2024   ELDERBERRY PO Take 1 capsule by mouth daily.   fluticasone 50 MCG/ACT nasal spray Commonly known as: FLONASE Place 1 spray into both nostrils 2 (two) times daily as needed for allergies or rhinitis.   gabapentin  400 MG capsule Commonly known as: NEURONTIN  Take 400 mg by mouth 3 (three) times daily.   irbesartan 75 MG tablet Commonly known as: AVAPRO Take 1 tablet (75 mg total) by mouth daily. Start taking on: June 24, 2024   ondansetron  4 MG tablet Commonly known as: Zofran  Take 1 tablet (4 mg total) by mouth daily  as needed for nausea or vomiting.   ONE-A-DAY WOMENS PO Take 1 tablet by mouth daily with breakfast.   OVER THE COUNTER MEDICATION Take 1 tablet by mouth See admin instructions. Calcium/Zinc tablet- Take 1 tablet by mouth once a day   pantoprazole  40 MG tablet Commonly known as: PROTONIX  Take 40 mg by mouth 2 (two) times daily.   SLOW FE PO Take 1 tablet by mouth daily with breakfast.   Vitamin B-12 500 MCG Subl Place 500 mcg under the tongue daily.   Vitamin D3 50 MCG (2000 UT) Tabs Take 4,000 Units by mouth daily.        Follow-up Information     [facility] Follow up.   Why: [facility] will contact you for the first appointment Contact information: [address] [place], [place] [address]  [phone]        [facility]. Schedule an appointment as soon as possible for a visit in 1 week(s).    Specialty: Neurology Contact information: [address] [place]  [address] [phone]        Gladis Dannielle DEL, [place]. Schedule an appointment as soon as possible for a visit in 1 week(s).   Contact information: [address] [place] [address] [phone]                Allergies  Allergen Reactions   Ibuprofen Other (See Comments)    Concerns w/ Stomach bleeds   Nsaids Other (See Comments)    GI Issues   Other Nausea And Vomiting    General anesthesia- CAN TOLERATE ONLY PROPOFOL    Codeine Nausea And Vomiting        Hydrocodone-Acetaminophen  Nausea And Vomiting   Hydromorphone  Itching   Morphine  And Codeine Nausea And Vomiting and Other (See Comments)    Causes N/V after an extended period of time   Oxycodone  Other (See Comments)    Can tolerate a very low dose (with ginger ale and dry toast)   Propoxyphene Nausea And Vomiting   Tizanidine Other (See Comments)    made me feel weird   Tramadol Hcl Nausea And Vomiting and Other (See Comments)    Face got hot and vertigo- with 25 mg taken (too)   Prednisone Rash    Consultations: Neurology   Procedures/Studies: EEG adult Result Date: 06/22/2024 Shelton Arlin KIDD, MD     06/22/2024  5:44 PM Patient Name: Kendra [name] MRN: [id number] Epilepsy Attending: Arlin KIDD Shelton Referring Physician/Provider: Jerri Pfeiffer, MD Date: 06/22/2024 Duration: 23.20 mins Patient history: 73yo f with episodic visual disturbance.. EEG to evaluate for seizure Level of alertness: Awake, asleep AEDs during EEG study: GBP Technical aspects: This EEG study was done with scalp electrodes positioned according to the 10-20 International system of electrode placement. Electrical activity was reviewed with band pass filter of 1-70Hz , sensitivity of 7 uV/mm, display speed of 27mm/sec with a 60Hz  notched filter applied as appropriate. EEG data were recorded continuously and digitally stored.  Video  monitoring was available and reviewed as appropriate. Description: The posterior dominant rhythm consists of 8 Hz activity of moderate voltage (25-35 uV) seen predominantly in posterior head regions, symmetric and reactive to eye opening and eye closing. Sleep was characterized by vertex waves, sleep spindles (12 to 14 Hz), maximal frontocentral region.  Hyperventilation and photic stimulation were not performed.   IMPRESSION: This study is within normal limits. No seizures or epileptiform discharges were seen throughout the recording. A normal  interictal EEG does not exclude the diagnosis of epilepsy. Priyanka O Yadav   VAS US  LOWER EXTREMITY VENOUS (DVT) Result Date: 06/22/2024  Lower Venous DVT Study Patient Name:  TOYOKO SILOS  Date of Exam:   06/22/2024 Medical Rec #: [id number]         Accession #:    [id number] Date of Birth: 01-06-1951         Patient Gender: F Patient Age:   27 years Exam Location:  [facility] Procedure:      VAS US  LOWER EXTREMITY VENOUS (DVT) Referring Phys: ARY XU --------------------------------------------------------------------------------  Indications: Stroke.  Limitations: Poor ultrasound/tissue interface. Comparison Study: No previous exams Performing Technologist: Jody Hill RVT, RDMS  Examination Guidelines: A complete evaluation includes B-mode imaging, spectral Doppler, color Doppler, and power Doppler as needed of all accessible portions of each vessel. Bilateral testing is considered an integral part of a complete examination. Limited examinations for reoccurring indications may be performed as noted. The reflux portion of the exam is performed with the patient in reverse Trendelenburg.  +---------+---------------+---------+-----------+----------+--------------+ RIGHT    CompressibilityPhasicitySpontaneityPropertiesThrombus Aging +---------+---------------+---------+-----------+----------+--------------+ CFV      Full           Yes      Yes                                  +---------+---------------+---------+-----------+----------+--------------+ SFJ      Full                                                        +---------+---------------+---------+-----------+----------+--------------+ FV Prox  Full           Yes      Yes                                 +---------+---------------+---------+-----------+----------+--------------+ FV Mid   Full           Yes      Yes                                 +---------+---------------+---------+-----------+----------+--------------+ FV DistalFull           Yes      Yes                                 +---------+---------------+---------+-----------+----------+--------------+ PFV      Full                                                        +---------+---------------+---------+-----------+----------+--------------+ POP      Full           Yes      Yes                                 +---------+---------------+---------+-----------+----------+--------------+ PTV      Full                                                        +---------+---------------+---------+-----------+----------+--------------+  PERO     Full                                                        +---------+---------------+---------+-----------+----------+--------------+   +---------+---------------+---------+-----------+----------+--------------+ LEFT     CompressibilityPhasicitySpontaneityPropertiesThrombus Aging +---------+---------------+---------+-----------+----------+--------------+ CFV      Full           Yes      Yes                                 +---------+---------------+---------+-----------+----------+--------------+ SFJ      Full                                                        +---------+---------------+---------+-----------+----------+--------------+ FV Prox  Full           Yes      Yes                                  +---------+---------------+---------+-----------+----------+--------------+ FV Mid   Full           Yes      Yes                                 +---------+---------------+---------+-----------+----------+--------------+ FV DistalFull           Yes      Yes                                 +---------+---------------+---------+-----------+----------+--------------+ PFV      Full                                                        +---------+---------------+---------+-----------+----------+--------------+ POP      Full           Yes      Yes                                 +---------+---------------+---------+-----------+----------+--------------+ PTV      Full                                                        +---------+---------------+---------+-----------+----------+--------------+ PERO     Full                                                        +---------+---------------+---------+-----------+----------+--------------+  Summary: BILATERAL: - No evidence of deep vein thrombosis seen in the lower extremities, bilaterally. -No evidence of popliteal cyst, bilaterally.   *See table(s) above for measurements and observations. Electronically signed by Debby Robertson on 06/22/2024 at 5:15:03 PM.    Final    ECHOCARDIOGRAM COMPLETE Result Date: 06/22/2024    ECHOCARDIOGRAM REPORT   Patient Name:   Kendra [name] Date of Exam: 06/22/2024 Medical Rec #:  [id number]        Height:       65.0 in Accession #:    [id number]       Weight:       230.0 lb Date of Birth:  06/01/1951        BSA:          2.099 m Patient Age:    73 years         BP:           168/76 mmHg Patient Gender: F                HR:           80 bpm. Exam Location:  Inpatient Procedure: 2D Echo, Cardiac Doppler and Color Doppler (Both Spectral and Color            Flow Doppler were utilized during procedure). Indications:    Stroke I63.9  History:        Patient has no prior history of Echocardiogram  examinations.                 CHF, CAD, Stroke and COPD; Risk Factors:Hypertension, Diabetes,                 Sleep Apnea and Dyslipidemia.  Sonographer:    Koleen Popper RDCS Referring Phys: [id number] RONDELL A SMITH  Sonographer Comments: Image acquisition challenging due to respiratory motion. IMPRESSIONS  1. Left ventricular ejection fraction, by estimation, is 60 to 65%. The left ventricle has normal function. The left ventricle has no regional wall motion abnormalities. There is mild asymmetric left ventricular hypertrophy of the septal segment. Left ventricular diastolic parameters were normal.  2. Right ventricular systolic function is normal. The right ventricular size is mildly enlarged.  3. The mitral valve is normal in structure. No evidence of mitral valve regurgitation. No evidence of mitral stenosis.  4. The aortic valve is normal in structure. Aortic valve regurgitation is not visualized. No aortic stenosis is present.  5. The inferior vena cava is normal in size with greater than 50% respiratory variability, suggesting right atrial pressure of 3 mmHg. FINDINGS  Left Ventricle: Left ventricular ejection fraction, by estimation, is 60 to 65%. The left ventricle has normal function. The left ventricle has no regional wall motion abnormalities. The left ventricular internal cavity size was normal in size. There is  mild asymmetric left ventricular hypertrophy of the septal segment. Left ventricular diastolic parameters were normal. Right Ventricle: The right ventricular size is mildly enlarged. No increase in right ventricular wall thickness. Right ventricular systolic function is normal. Left Atrium: Left atrial size was normal in size. Right Atrium: Right atrial size was normal in size. Pericardium: There is no evidence of pericardial effusion. Presence of epicardial fat layer. Mitral Valve: The mitral valve is normal in structure. No evidence of mitral valve regurgitation. No evidence of mitral valve  stenosis. MV peak gradient, 8.5 mmHg. The mean mitral valve gradient is 2.0 mmHg. Tricuspid Valve: The tricuspid valve is normal in structure. Tricuspid valve regurgitation  is not demonstrated. No evidence of tricuspid stenosis. Aortic Valve: The aortic valve is normal in structure. Aortic valve regurgitation is not visualized. No aortic stenosis is present. Pulmonic Valve: The pulmonic valve was normal in structure. Pulmonic valve regurgitation is not visualized. No evidence of pulmonic stenosis. Aorta: The aortic root is normal in size and structure. Venous: The inferior vena cava is normal in size with greater than 50% respiratory variability, suggesting right atrial pressure of 3 mmHg. IAS/Shunts: No atrial level shunt detected by color flow Doppler.  LEFT VENTRICLE PLAX 2D LVIDd:         4.04 cm   Diastology LVIDs:         2.59 cm   LV e' medial:    7.62 cm/s LV PW:         0.98 cm   LV E/e' medial:  12.5 LV IVS:        1.11 cm   LV e' lateral:   5.98 cm/s LVOT diam:     1.79 cm   LV E/e' lateral: 15.9 LV SV:         50 LV SV Index:   24 LVOT Area:     2.52 cm  RIGHT VENTRICLE             IVC RV Basal diam:  4.38 cm     IVC diam: 1.33 cm RV Mid diam:    3.49 cm RV S prime:     12.40 cm/s TAPSE (M-mode): 2.7 cm LEFT ATRIUM             Index        RIGHT ATRIUM           Index LA diam:        3.31 cm 1.58 cm/m   RA Area:     11.60 cm LA Vol (A2C):   41.7 ml 19.86 ml/m  RA Volume:   24.50 ml  11.67 ml/m LA Vol (A4C):   40.8 ml 19.44 ml/m LA Biplane Vol: 43.8 ml 20.87 ml/m  AORTIC VALVE LVOT Vmax:   92.00 cm/s LVOT Vmean:  61.600 cm/s LVOT VTI:    0.198 m  AORTA Ao Root diam: 2.68 cm Ao Asc diam:  2.72 cm MITRAL VALVE MV Area (PHT): 4.15 cm     SHUNTS MV Area VTI:   1.26 cm     Systemic VTI:  0.20 m MV Peak grad:  8.5 mmHg     Systemic Diam: 1.79 cm MV Mean grad:  2.0 mmHg MV Vmax:       1.46 m/s MV Vmean:      62.9 cm/s MV Decel Time: 183 msec MV E velocity: 95.30 cm/s MV A velocity: 136.00 cm/s MV E/A  ratio:  0.70 Kardie Tobb DO Electronically signed by Dub Huntsman DO Signature Date/Time: 06/22/2024/2:31:17 PM    Final    CT C-SPINE NO CHARGE Result Date: 06/21/2024 CLINICAL DATA:  Neuro deficit, acute, stroke suspected. Numbness in the fourth and fifth digits of the right hand. EXAM: CT CERVICAL SPINE WITH CONTRAST TECHNIQUE: Multiplanar CT images of the cervical spine were reconstructed from contemporary CTA of the Neck. RADIATION DOSE REDUCTION: This exam was performed according to the departmental dose-optimization program which includes automated exposure control, adjustment of the mA and/or kV according to patient size and/or use of iterative reconstruction technique. CONTRAST:  No additional. COMPARISON:  None Available. FINDINGS: Alignment: Reversal of the normal cervical lordosis. Trace anterolisthesis of C7 on T1. Skull base  and vertebrae: No acute fracture or suspicious lesion. Soft tissues and spinal canal: No prevertebral fluid or swelling. No visible canal hematoma. Disc levels: Moderate disc space narrowing and degenerative endplate changes at C4-5, C5-6, and C7-T1. Asymmetrically advanced facet arthrosis on the left at C2-3 and on the right at C3-4. Mild spinal stenosis at C4-5 and C5-6. Moderate left neural foraminal stenosis at C5-6 and no more than mild apparent neural foraminal stenosis elsewhere. No evidence of high-grade right-sided neural foraminal stenosis in the lower cervical spine to correlate with the patient's right hand numbness. Upper chest: No mass or consolidation in the included lung apices. Other: CTA reported separately. IMPRESSION: 1. No acute osseous abnormality. 2. Cervical disc and facet degeneration with mild spinal stenosis and moderate left neural foraminal stenosis at C5-6. Electronically Signed   By: Dasie Hamburg M.D.   On: 06/21/2024 16:40   CT ANGIO HEAD NECK W WO CM Result Date: 06/21/2024 CLINICAL DATA:  Neuro deficit, acute, stroke suspected. Right hand  tingling, headache, and hypertension. EXAM: CT ANGIOGRAPHY HEAD AND NECK WITH AND WITHOUT CONTRAST TECHNIQUE: Multidetector CT imaging of the head and neck was performed using the standard protocol during bolus administration of intravenous contrast. Multiplanar CT image reconstructions and MIPs were obtained to evaluate the vascular anatomy. Carotid stenosis measurements (when applicable) are obtained utilizing NASCET criteria, using the distal internal carotid diameter as the denominator. RADIATION DOSE REDUCTION: This exam was performed according to the departmental dose-optimization program which includes automated exposure control, adjustment of the mA and/or kV according to patient size and/or use of iterative reconstruction technique. CONTRAST:  75mL OMNIPAQUE IOHEXOL 350 MG/ML SOLN COMPARISON:  None Available. FINDINGS: CTA NECK FINDINGS Aortic arch: Standard branching with moderate atherosclerotic calcification. Patent brachiocephalic and subclavian arteries without evidence of a significant stenosis. Right carotid system: Patent with a small amount of scattered plaque in the common carotid artery and a moderate amount of calcified plaque in the carotid bulb. No evidence of a significant stenosis or dissection. Left carotid system: Patent with mild scattered atherosclerotic plaque in the common carotid artery and at the carotid bifurcation. No evidence of a significant stenosis or dissection. Vertebral arteries: Patent and codominant with calcified plaque at both vertebral artery origins resulting in at most mild stenosis. No evidence of a dissection. Skeleton: Cervical spine reported separately. No destructive process. Other neck: No evidence of cervical lymphadenopathy or mass. Upper chest: No mass or consolidation in the included lung apices. Review of the MIP images confirms the above findings CTA HEAD FINDINGS Anterior circulation: The internal carotid arteries are patent from skull base to carotid  termini with mild atherosclerosis not resulting in significant stenosis. ACAs and MCAs are patent without evidence of a proximal branch occlusion or significant proximal stenosis. No aneurysm is identified. Posterior circulation: The intracranial vertebral arteries are patent with calcified plaque resulting in mild stenosis bilaterally. Patent PICA, AICA, and SCA origins are visualized bilaterally. The basilar artery is widely patent. Posterior communicating arteries are diminutive or absent. Both PCAs are patent without evidence of a significant proximal stenosis. No aneurysm is identified. Venous sinuses: Patent. Anatomic variants: None. Review of the MIP images confirms the above findings IMPRESSION: 1. Mild atherosclerosis in the head and neck without a large vessel occlusion or flow limiting proximal stenosis. 2.  Aortic Atherosclerosis (ICD10-I70.0). Electronically Signed   By: Dasie Hamburg M.D.   On: 06/21/2024 16:10   DG Chest 1 View Result Date: 06/21/2024 EXAM: 1 VIEW(S) XRAY OF THE CHEST  06/21/2024 02:46:00 PM COMPARISON: Comparison 04/13/2020 stable hiatal hernia. CLINICAL HISTORY: Nstemi. Nstemi, HA x Monday, tingling in right fingers, HTN, nonsmoker. FINDINGS: LUNGS AND PLEURA: No focal pulmonary opacity. No pulmonary edema. No pleural effusion. No pneumothorax. HEART AND MEDIASTINUM: No acute abnormality of the cardiac and mediastinal silhouettes. Stable hiatal hernia. BONES AND SOFT TISSUES: No acute osseous abnormality. IMPRESSION: 1. No acute cardiopulmonary process 2. Stable hiatal hernia Electronically signed by: Lynwood Seip MD 06/21/2024 02:55 PM EDT RP Workstation: [id number]   MR BRAIN WO CONTRAST Result Date: 06/21/2024 CLINICAL DATA:  Right hand numbness, headache, and hypertension. EXAM: MRI HEAD WITHOUT CONTRAST TECHNIQUE: Multiplanar, multiecho pulse sequences of the brain and surrounding structures were obtained without intravenous contrast. COMPARISON:  Head CT 06/21/2024  FINDINGS: The study is intermittently up to moderately motion degraded. Brain: There are punctate acute infarcts in the right middle cerebellar peduncle and more inferiorly in the right cerebellar hemisphere. Small acute supratentorial infarcts involve the splenium of the corpus callosum on the left and left parietal and posterior left frontal cortex and subcortical white matter. The largest infarct is that in the corpus callosum and measures 7 mm. No intracranial hemorrhage, mass, midline shift, or extra-axial fluid collection is identified. There is mild-to-moderate cerebral atrophy. T2 hyperintensities in the cerebral white matter and pons are nonspecific but compatible with mild chronic small vessel ischemic disease. 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 Vascular: Major intracranial vascular flow voids are preserved. Skull and upper cervical spine: Unremarkable bone marrow signal. Sinuses/Orbits: Unremarkable orbits. Paranasal sinuses and mastoid air cells are clear. Other: None. IMPRESSION: 1. Small acute supratentorial and infratentorial infarcts suggestive of emboli. 2. Mild chronic small vessel ischemic disease. Electronically Signed   By: Dasie Hamburg M.D.   On: 06/21/2024 13:21   CT HEAD WO CONTRAST Result Date: 06/21/2024 EXAM: CT HEAD WITHOUT CONTRAST 06/21/2024 10:59:34 AM TECHNIQUE: CT of the head was performed without the administration of intravenous contrast. Automated exposure control, iterative reconstruction, and/or weight based adjustment of the mA/kV was utilized to reduce the radiation dose to as low as reasonably achievable. COMPARISON: None available. CLINICAL HISTORY: Neuro deficit, acute, stroke suspected. Patient bib [facility] from work with complaints of headache, hypertension, tingling in the right hand that comes and goes. EMS reports neg stroke screen. PMH: migraines with visual spots. FINDINGS: BRAIN AND VENTRICLES: No  acute hemorrhage. No evidence of acute infarct. Nonspecific hypoattenuation in the periventricular and subcortical white matter, most likely representing chronic microvascular ischemic changes. Mild generalized parenchymal volume loss. There is a remote infarct in the periventricular white matter adjacent to the right frontal horn partially involving the genu of the corpus callosum. Small remote lacunar infarct in the left thalamus. Mineralization in the basal ganglia and bilateral cerebellum. No hydrocephalus. No extra-axial collection. No mass effect or midline shift. ORBITS: No acute abnormality. SINUSES: No acute abnormality. SOFT TISSUES AND SKULL: No acute soft tissue abnormality. No skull fracture. VASCULATURE: Atherosclerosis of the carotid siphons and intracranial vertebral arteries. IMPRESSION: 1. No acute intracranial abnormality. 2. 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 3. Chronic microvascular ischemic changes and mild volume loss. Electronically signed by: Donnice Mania MD 06/21/2024 11:14 AM EDT RP Workstation: [id number]      Subjective: Patient seen and examined at bedside today.  Hemodynamically stable.  No focal deficits or sensory loss.  Alert and oriented.  Complains of some headache and mild nausea this  morning.  Hemodynamically stable for discharge today.  Underwent loop recorder  placement  Discharge Exam: Vitals:   06/23/24 1153 06/23/24 1311  BP: 138/73 (!) 133/56  Pulse: 79 83  Resp: 19 17  Temp: 98.4 F (36.9 C) 97.8 F (36.6 C)  SpO2: 96% 97%   Vitals:   06/23/24 0412 06/23/24 0750 06/23/24 1153 06/23/24 1311  BP: (!) 161/72 (!) 163/89 138/73 (!) 133/56  Pulse: 86 74 79 83  Resp: 16 18 19 17   Temp: 98.9 F (37.2 C) 98.8 F (37.1 C) 98.4 F (36.9 C) 97.8 F (36.6 C)  TempSrc: Oral Oral Oral Oral  SpO2: 97% 94% 96% 97%  Weight:      Height:        General: Pt is alert, awake, not in acute distress Cardiovascular:  RRR, S1/S2 +, no rubs, no gallops Respiratory: CTA bilaterally, no wheezing, no rhonchi Abdominal: Soft, NT, ND, bowel sounds + Extremities: no edema, no cyanosis    The results of significant diagnostics from this hospitalization (including imaging, microbiology, ancillary and laboratory) are listed below for reference.     Microbiology: No results found for this or any previous visit (from the past 240 hours).   Labs: BNP (last 3 results) No results for input(s): BNP in the last 8760 hours. Basic Metabolic Panel: Recent Labs  Lab 06/21/24 1049  NA 142  K 3.8  CL 103  CO2 24  GLUCOSE 97  BUN 11  CREATININE 0.74  CALCIUM 9.0   Liver Function Tests: Recent Labs  Lab 06/21/24 1049  AST 27  ALT 17  ALKPHOS 90  BILITOT 0.5  PROT 7.0  ALBUMIN 3.9   No results for input(s): LIPASE, AMYLASE in the last 168 hours. No results for input(s): AMMONIA in the last 168 hours. CBC: Recent Labs  Lab 06/21/24 1049  WBC 8.3  NEUTROABS 5.6  HGB 14.1  HCT 44.5  MCV 96.5  PLT 276   Cardiac Enzymes: No results for input(s): CKTOTAL, CKMB, CKMBINDEX, TROPONINI in the last 168 hours. BNP: Invalid input(s): POCBNP CBG: Recent Labs  Lab 06/21/24 1133  GLUCAP 97   D-Dimer No results for input(s): DDIMER in the last 72 hours. Hgb A1c Recent Labs    06/21/24 1843  HGBA1C 5.5   Lipid Profile Recent Labs    06/21/24 1843  CHOL 275*  HDL 51  LDLCALC 165*  TRIG 293*  CHOLHDL 5.4   Thyroid function studies No results for input(s): TSH, T4TOTAL, T3FREE, THYROIDAB in the last 72 hours.  Invalid input(s): FREET3 Anemia work up No results for input(s): VITAMINB12, FOLATE, FERRITIN, TIBC, IRON, RETICCTPCT in the last 72 hours. Urinalysis No results found for: COLORURINE, APPEARANCEUR, LABSPEC, PHURINE, GLUCOSEU, HGBUR, BILIRUBINUR, KETONESUR, PROTEINUR, UROBILINOGEN, NITRITE, LEUKOCYTESUR Sepsis  Labs Recent Labs  Lab 06/21/24 1049  WBC 8.3   Microbiology No results found for this or any previous visit (from the past 240 hours).  Please note: You were cared for by a hospitalist during your hospital stay. Once you are discharged, your primary care physician will handle any further medical issues. Please note that NO REFILLS for any discharge medications will be authorized once you are discharged, as it is imperative that you return to your primary care physician (or establish a relationship with a primary care physician if you do not have one) for your post hospital discharge needs so that they can reassess your need for medications and monitor your lab values.    Time coordinating discharge: 40  minutes  SIGNED:   Ivonne Mustache, MD  [facility] 06/23/2024, 1:59 PM Pager [phone]  If 7PM-7AM, please contact night-coverage [email or web address] Password [email or web address]

## 2024-06-23 NOTE — Progress Notes (Signed)
 Reviewed AVS collaboratively with Virtual D/C nurse, patient expressed understanding of medications, MD follow up reviewed.   Patient states all belongings brought to the hospital at time of admission are accounted for and packed to take home.  Picked up medications from [facility] pharmacy. Nursing staff transported patient to entrance A, where family member was waiting in vehicle to transport home.

## 2024-06-23 NOTE — Evaluation (Signed)
 Speech Language Pathology Evaluation Patient Details Name: Kendra [name] MRN: [id number] DOB: 1950-10-23 Today's Date: 06/23/2024 Time: 9168-9153 SLP Time Calculation (min) (ACUTE ONLY): 15 min  Problem List:  Patient Active Problem List   Diagnosis Date Noted   CVA (cerebral vascular accident) (HCC) 06/21/2024   Elevated troponin 06/21/2024   History of migraine headaches 06/21/2024   Neuropathy 06/21/2024   Obesity, Class II, BMI 35-39.9 06/21/2024   Hiatal hernia with GERD 06/21/2024   Hyperlipidemia 06/21/2024   Food impaction of esophagus    Esophageal dysphagia    Essential hypertension 04/14/2020   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   Past Medical History:  Past Medical History:  Diagnosis Date   Arthritis    Complication of anesthesia    Family history of adverse reaction to anesthesia    Fibromyalgia    GERD (gastroesophageal reflux disease)    Hypertension    PONV (postoperative nausea and vomiting)    Sleep apnea    uses mouth guard   Past Surgical History:  Past Surgical History:  Procedure Laterality Date   ABDOMINAL HYSTERECTOMY  2001   CARTILAGE SURGERY     CESAREAN SECTION  1967   CHOLECYSTECTOMY  2005   ESOPHAGOGASTRODUODENOSCOPY (EGD) WITH PROPOFOL  N/A 08/16/2020   Procedure: ESOPHAGOGASTRODUODENOSCOPY (EGD) WITH PROPOFOL ;  Surgeon: Aneita Gwendlyn DASEN, MD;  Location: [facility] ENDOSCOPY;  Service: Endoscopy;  Laterality: N/A;   FOREIGN BODY REMOVAL N/A 08/16/2020   Procedure: FOREIGN BODY REMOVAL;  Surgeon: Aneita Gwendlyn DASEN, MD;  Location: [facility] ENDOSCOPY;  Service: Endoscopy;  Laterality: N/A;   HIP PINNING,CANNULATED Right 04/14/2020   Procedure: CANNULATED HIP PINNING;  Surgeon: Jerri Kay HERO, MD;  Location: [facility] OR;  Service: Orthopedics;  Laterality: Right;   MENISCUS REPAIR     TOTAL KNEE ARTHROPLASTY Right 09/12/2018   Procedure: TOTAL KNEE ARTHROPLASTY;  Surgeon: Rubie Kemps, MD;  Location: [facility] ORS;  Service: Orthopedics;  Laterality: Right;    TUBAL LIGATION  1984   WRIST SURGERY Right    Torn Ligament   HPI:  73 y.o. female presents to [facility] 06/21/24 with h/a and numbness in R hand. 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 PMHx: arthritis, hypertension, fibromyalgia, sleep apnea   Assessment / Plan / Recommendation Clinical Impression  Pt presents with mild and at times moderate cognitive impairments primarily in the areas of attention and memory. Speech is intelligible and language appropriate. She was unable to state year (which normally is able) with extended time needing cue. She required repetition when presented with mildly increased amount of information and stated I wasn't paying attention. Unable to recall any words with delay independently with decreased ability to store and retireive information. Pt states she only uses pill box when has increased volume of medication and handles her finances. Recommend pt continue with cognitive therapy in the OP setting. Discussed recommendation of having son supervise with medication and finances for as long as needed. Pt aware her cognition is different and is in agreement with outpatient tx.    SLP Assessment  SLP Recommendation/Assessment: All further Speech Language Pathology needs can be addressed in the next venue of care SLP Visit Diagnosis: Cognitive communication deficit (R41.841)     Assistance Recommended at Discharge  Intermittent Supervision/Assistance  Functional Status Assessment Patient has had a recent decline in their functional status and  demonstrates the ability to make significant improvements in function in a reasonable and predictable amount of time.  Frequency and Duration           SLP Evaluation Cognition  Overall Cognitive Status: Impaired/Different from  baseline Arousal/Alertness: Awake/alert Orientation Level: Oriented to person;Oriented to place;Oriented to situation (did not know year, correct for month) Year:  (did not know after long delay- correct with cue) Month: October Attention: Sustained Sustained Attention: Impaired Sustained Attention Impairment: Verbal basic Memory: Impaired Memory Impairment: Storage deficit;Retrieval deficit (0/4 independently, 3/4 with choice, 1/4 incorrect with choice) Awareness: Appears intact Problem Solving: Appears intact (for verbal, may have trouble during activity) Safety/Judgment: Impaired       Comprehension  Auditory Comprehension Overall Auditory Comprehension: Appears within functional limits for tasks assessed Commands: Impaired (2/3 not due to comprehension but decr attention) Interfering Components: Attention Visual Recognition/Discrimination Discrimination: Not tested Reading Comprehension Reading Status: Not tested    Expression Expression Primary Mode of Expression: Verbal Verbal Expression Overall Verbal Expression: Appears within functional limits for tasks assessed Initiation: No impairment Level of Generative/Spontaneous Verbalization: Conversation Repetition:  (NT) Naming: Not tested Pragmatics: No impairment Written Expression Written Expression: Not tested   Oral / Motor  Motor Speech Overall Motor Speech: Appears within functional limits for tasks assessed Respiration: Within functional limits Phonation: Normal Resonance: Within functional limits Articulation: Within functional limitis Intelligibility: Intelligible Motor Planning: Within functional limits Motor Speech Errors: Not applicable            Dustin Olam Bull 06/23/2024, 9:08 AM

## 2024-06-23 NOTE — Progress Notes (Signed)
 Occupational Therapy Treatment Patient Details Name: Kendra [name] MRN: [id number] DOB: 1950/12/28 Today's Date: 06/23/2024   History of present illness 73 y.o. female presents to [facility] 06/21/24 with h/a and numbness in R hand. 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 PMHx: arthritis, hypertension, fibromyalgia, sleep apnea   OT comments  Pt greeted supine in bed, pt reported HA but agreeable to session. Session focused on visual strategies d/t continued reports of peripheral deficits. Pt describes her vision like looking through a net. Extensive education on lighthouse strategy for visual scanning with handout provided. Pt very receptive/appreciative of education. Pt reports her deficits only seem to be distance related. Pt also experiencing some light sensitivity asking for the lights to be out and shades to be lowered. Recommended OP OT for continued OT f/u. Will continue to follow acutely per POC.       If plan is discharge home, recommend the following:  Assist for transportation   Equipment Recommendations  None recommended by OT    Recommendations for Other Services      Precautions / Restrictions Precautions Precautions: Fall Restrictions Weight Bearing Restrictions Per Provider Order: No       Mobility Bed Mobility               General bed mobility comments: session focused on vision    Transfers                   General transfer comment: session focused on vision education     Balance                                           ADL either performed or assessed with clinical judgement   ADL Overall ADL's : Modified independent                                       General ADL Comments: pt declined ADL  participation d/t pain from HA, session focus on visual compensation strategies    Extremity/Trunk Assessment Upper Extremity Assessment Upper Extremity Assessment: Overall WFL for tasks assessed   Lower Extremity Assessment Lower Extremity Assessment: Defer to PT evaluation   Cervical / Trunk Assessment Cervical / Trunk Assessment: Normal    Vision Baseline Vision/History: 0 No visual deficits Patient Visual Report: Peripheral vision impairment Additional Comments: pt continues to report describing her vision like looking through a net, education provided on lighthouse strategy and use of visual scanning in functional context. pt receptive to education. pt also endorse seeing weird colors. discussed not driving and following up with her eye doctor.   Perception Perception Perception: Not tested   Praxis Praxis Praxis: Not tested   Communication Communication Communication: No apparent difficulties   Cognition Arousal: Alert Behavior During Therapy: WFL for tasks assessed/performed Cognition: No apparent impairments                               Following commands: Intact        Cueing   Cueing Techniques: Verbal cues  Exercises  Shoulder Instructions       General Comments      Pertinent Vitals/ Pain       Pain Assessment Faces Pain Scale: Hurts a little bit Pain Location: head Pain Descriptors / Indicators: Headache Pain Intervention(s): Monitored during session  Home Living     Available Help at Discharge: Family;Available PRN/intermittently Type of Home: House                              Lives With: Alone    Prior Functioning/Environment              Frequency  Min 2X/week        Progress Toward Goals  OT Goals(current goals can now be found in the care plan section)  Progress towards OT goals: Progressing toward goals  Acute Rehab OT Goals Patient Stated Goal: to go home and improve vision OT Goal  Formulation: With patient Time For Goal Achievement: 07/06/24 Potential to Achieve Goals: Good  Plan      Co-evaluation                 AM-PAC OT 6 Clicks Daily Activity     Outcome Measure   Help from another person eating meals?: None Help from another person taking care of personal grooming?: None Help from another person toileting, which includes using toliet, bedpan, or urinal?: None Help from another person bathing (including washing, rinsing, drying)?: None Help from another person to put on and taking off regular upper body clothing?: None Help from another person to put on and taking off regular lower body clothing?: None 6 Click Score: 24    End of Session    OT Visit Diagnosis: Low vision, both eyes (H54.2)   Activity Tolerance Patient tolerated treatment well   Patient Left in bed;with call bell/phone within reach;with bed alarm set   Nurse Communication          Time: 8986-8977 OT Time Calculation (min): 9 min  Charges: OT General Charges $OT Visit: 1 Visit OT Treatments $Self Care/Home Management : 8-22 mins  Ronal Mallie POUR., COTA/L Acute Rehabilitation Services [phone]   Ronal Mallie Needy 06/23/2024, 10:50 AM

## 2024-06-23 NOTE — Plan of Care (Signed)
  Problem: Education: Goal: Knowledge of disease or condition will improve Outcome: Progressing Goal: Knowledge of patient specific risk factors will improve (DELETE if not current risk factor) Outcome: Progressing   Problem: Coping: Goal: Will identify appropriate support needs Outcome: Progressing   Problem: Nutrition: Goal: Risk of aspiration will decrease Outcome: Progressing   Problem: Education: Goal: Knowledge of General Education information will improve Description: Including pain rating scale, medication(s)/side effects and non-pharmacologic comfort measures Outcome: Progressing

## 2024-06-23 NOTE — Plan of Care (Signed)

## 2024-06-23 NOTE — Progress Notes (Addendum)
 Progress Note  Patient Name: Kendra [name] Date of Encounter: 06/23/2024 [facility] Cardiologist: Stanly Kendra Leavens, MD   Interval Summary   Doing well, up with PT when I am in the room Complains of new 7-8/10 headache this morning No relief with Tylenol  Initially denies weakness and visual changes but then prior to me leaving the room she says she sees pink and black stripes on the wall I have informed her neurology team   Vital Signs Vitals:   06/22/24 1954 06/22/24 2350 06/23/24 0412 06/23/24 0750  BP: (!) 158/65 126/78 (!) 161/72 (!) 163/89  Pulse: 85 90 86 74  Resp: 17 18 16 18   Temp: 99.1 F (37.3 C) 98.4 F (36.9 C) 98.9 F (37.2 C) 98.8 F (37.1 C)  TempSrc: Oral Oral Oral Oral  SpO2: 98% 97% 97% 94%  Weight:      Height:        Intake/Output Summary (Last 24 hours) at 06/23/2024 1103 Last data filed at 06/22/2024 2044 Gross per 24 hour  Intake 240 ml  Output --  Net 240 ml      06/21/2024   10:28 AM 10/08/2020   11:05 AM 08/16/2020    3:03 PM  Last 3 Weights  Weight (lbs) 230 lb 230 lb 230 lb  Weight (kg) 104.327 kg 104.327 kg 104.327 kg     Telemetry/ECG  Sinus, HR 70s - Personally Reviewed  Physical Exam  GEN: No acute distress.   Neck: No JVD Cardiac: RRR, no murmurs, rubs, or gallops.  Respiratory: Clear to auscultation bilaterally. GI: Soft, nontender, non-distended  MS: No edema  Assessment & Plan   Hypertensive emergency Essential hypertension BP remains elevated Plans per neuro: gradually reduce BP over 2-3 days Home meds noted to be: amlodipine 5 mg daily and valsartan 160 mg daily but appeared to not be taking  Adding irbesartan 75 mg daily, monitor BP response, spoke with neurology who confirmed she is out of window for permissive HTN and is okay with addition of low dose ARB  NSTEMI  Suspected to be in the setting of HTN emergency and acute ischemic stroke   Continue Plavix monotherapy per neurology  No  plans for inpatient ischemic  workup   Aortic atherosclerosis  Minimal carotid artery atherosclerosis  Hyperlipidemia, LDL goal < 70  CTA head/neck showed aortic atherosclerosis and minimal carotid plaques  LDL goal < 70 06/21/2024: ALT 17; HDL 51; LDL Cholesterol 165  Continue Lipitor 40 mg daily   Acute CVA Brain MRI showed small acute infarcts suggestive of emboli Echo: LVEF 60-65%, no PFO/intra-atrial shunt  LE doppler negative EP following to place ILR prior to discharge to monitor A-Fib Followed closely by neurology Started on Plavix monotherapy -- allergy to ASA  Per primary Acute stroke  Headache Neuropathy GERD   For questions or updates, please contact [facility] Please consult [email or web address] for contact info under         Signed, Waddell Kendra Donath, PA-C    I have personally evaluated and examined the patient. The history, physical exam, and medical decision making documented below were performed independently and substantively by me. I have reviewed all relevant data, formulated the assessment and plan, and assumed responsibility for the management of this patient. My documentation reflects the substantive portion of the split/shared visit, in accordance with CMS and CPT guidelines.   I have personally performed the substantive portion of the medical decision making, including interpretation of diagnostic data, formulation of the  management plan, and assessment of risks. In summation:  No CP, SOB, Palpitations.  Notes that the walls are still a different color.  I examined with concurrently with vascular neurology.  Notes HA that has persisted.  Son is in the room.  Exam notable for  GEN: Mild distress.  Morbid Obesity. Neck: No JVD Cardiac: RRR, no murmurs, rubs, or gallops.  Respiratory: Clear to auscultation bilaterally. GI: Soft, nontender, non-distended  MS: No edema  Tele: SR  In assessment and plan:   Non-ST elevation myocardial infarction  (NSTEMI) likely secondary to hypertensive emergency and acute ischemic stroke - Continue dual antiplatelet therapy as per neurology recommendations; - no thrombus on Echo, normal function , no plans for inpatient ischemic work up - MAC noted on Echo    Hypertensive emergency and essential hypertension Acute CVA - given her headache today, we discussed with neurology who saw her today wit us , the visual changes are likely related to where she had the stroke; we will start low dose ARB today for potentially BP and HA support   Aortic atherosclerosis and minimal carotid artery atherosclerosis - CT angiography of the head and neck reveals aortic atherosclerosis and minimal carotid plaques. These findings are significant for vascular disease, which may contribute to her cardiovascular risk profile. - LDL goal < 70; on atorvastatin   Planned for ILD today  We will began arranging f/u with our teams; planned for DC today or tomorrow based on sx and IM evaluation   Stanly Leavens, MD FASE [facility] Cardiologist [facility]  [address], [address] [place], [place] [address] [phone]  11:45 AM

## 2024-06-23 NOTE — Consult Note (Signed)
 ELECTROPHYSIOLOGY CONSULT NOTE  Patient ID: Kendra [name] MRN: [id number], DOB/AGE: 12/15/1950   Admit date: 06/21/2024 Date of Consult: 06/23/2024  Primary Physician: Kendra Dannielle DEL, PA-C Primary Cardiologist: Kendra [name] Leavens, MD  Primary Electrophysiologist: New to None  Reason for Consultation: Cryptogenic stroke; recommendations regarding Implantable Loop Recorder Insurance: [employer]  History of Present Illness: 73 y/o F who presented to [facility] with concern for stroke. EP has been asked to evaluate Kendra [name] for placement of an implantable loop recorder to monitor for atrial fibrillation by Dr Kendra [name].  The patient was admitted on 06/21/2024 with multifocal bilateral small infarcts embolic pattern, concerning for cardioembolic source .    The pt has undergone workup for stroke including:  CT head no acute finding, old left thalamic, R periventricular WM infarcts. CTA head and neck unremarkable  MRI  Small acute supratentorial and infratentorial infarcts suggestive of emboli. 2D Echo > LVEF 60-65%, no PFO/intra-atrial shunt   LE venous doppler > negative   EEG for episodic visual disturbance but most likely due to L splenium infarct > negative  LDL 165, TG 293 HgbA1c 5.5 UDS neg No antithrombotic prior to admission, now on clopidogrel 75 mg daily. Pt has ASA allergy Therapy recommendations: outpt PT Disposition:  none   The patient has been monitored on telemetry which has demonstrated sinus rhythm with no arrhythmias.  Inpatient stroke work-up will not require a TEE per Neurology.   Echocardiogram as above. Lab work is reviewed.  Prior to admission, the patient denies chest pain, shortness of breath, dizziness, palpitations, or syncope.  She is recovering from her stroke with plans to return home at discharge.  Allergies, Past Medical, Surgical, Social, and Family Histories have been reviewed and are referenced here-in when relevant for medical decision  making.   Inpatient Medications:   atorvastatin  40 mg Oral Daily   clopidogrel  75 mg Oral Daily   enoxaparin  (LOVENOX ) injection  40 mg Subcutaneous Q24H   gabapentin   300 mg Oral BID    Physical Exam: Vitals:   06/22/24 1954 06/22/24 2350 06/23/24 0412 06/23/24 0750  BP: (!) 158/65 126/78 (!) 161/72 (!) 163/89  Pulse: 85 90 86 74  Resp: 17 18 16 18   Temp: 99.1 F (37.3 C) 98.4 F (36.9 C) 98.9 F (37.2 C) 98.8 F (37.1 C)  TempSrc: Oral Oral Oral Oral  SpO2: 98% 97% 97% 94%  Weight:      Height:        GEN- NAD. A&O x 3. Normal affect. HEENT: Normocephalic, atraumatic Lungs- CTAB, Normal effort.  Heart- Regular rate and rhythm rate and rhythm. No M/G/R.  Extremities- No peripheral edema. no clubbing or cyanosis Skin- warm and dry, no rash or lesion. Neuro: Awake, alert, speech clear   12-lead ECG 06/21/24 > NSR 77 bpm with artifact (personally reviewed) All prior EKG's in EPIC reviewed with no documented atrial fibrillation  Telemetry SR 70-100's (personally reviewed)  Assessment and Plan:  Cryptogenic Stroke The patient presents with cryptogenic stroke.  The patient does not have a TEE planned for this AM.  I spoke at length with the patient about monitoring for afib with an implantable loop recorder, including monthly monitor fees which may range from $0-$40.  Risks, benefits, and alteratives to implantable loop recorder were discussed with the patient today.  At this time, the patient is very clear in their decision to proceed with implantable loop recorder.      Wound care was reviewed  with the patient (keep incision clean and dry for 3 days). Please call with questions.    Kendra Barrack, NP-C, AGACNP-BC [facility] - Electrophysiology  06/23/2024, 9:05 AM

## 2024-06-23 NOTE — Progress Notes (Signed)
 Physical Therapy Treatment Patient Details Name: Kendra [name] MRN: [id number] DOB: 02/16/1951 Today's Date: 06/23/2024   History of Present Illness 73 y.o. female presents to [facility] 06/21/24 with h/a and numbness in R hand. 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 Pt also with NSTEMI likely 2/2 HTN emergency and CVA. PMHx: arthritis, hypertension, fibromyalgia, sleep apnea    PT Comments  Pt received in supine with family present and agreeable to PT session. Worked on dynamic balance in today's session with pt scoring a 16/24 on the DGI indicating pt is at an increased risk of falling. Pt was steady when ambulating with no challenges to balance at ModI. Required supervision when performing head turns due to small losses of balance recovered with lateral side-steps. Pt reported having a h/a with nausea that was worse after ambulating in the hallway. Returned to the room with RN giving pain meds. Pt reported feeling comfortable d/c home whenever medically stable. Continue to recommend OP PT with acute PT to follow.     If plan is discharge home, recommend the following: Assist for transportation;Help with stairs or ramp for entrance   Can travel by private vehicle      Yes  Equipment Recommendations  None recommended by PT       Precautions / Restrictions Precautions Precautions: Fall Recall of Precautions/Restrictions: Intact Restrictions Weight Bearing Restrictions Per Provider Order: No     Mobility  Bed Mobility Overal bed mobility: Needs Assistance Bed Mobility: Supine to Sit, Sit to Supine    Supine to sit: Modified independent (Device/Increase time) Sit to supine: Modified independent (Device/Increase time)     Transfers Overall transfer level: Modified  independent Equipment used: None   Ambulation/Gait Ambulation/Gait assistance: Supervision, Modified independent (Device/Increase time) Gait Distance (Feet): 400 Feet Assistive device: None Gait Pattern/deviations: Step-through pattern, Decreased stride length Gait velocity: decr     General Gait Details: steady with no challenges to balance with increased medial/lateral sway. Slightly unsteady with horizontal head turns   Stairs Stairs: Yes Stairs assistance: Supervision Stair Management: One rail Left, Step to pattern, Alternating pattern, Forwards Number of Stairs: 3 General stair comments: alternating pattern to ascent, step to for descent. Use of L handrail      Modified Rankin (Stroke Patients Only) Modified Rankin (Stroke Patients Only) Pre-Morbid Rankin Score: No symptoms Modified Rankin: Slight disability     Balance Overall balance assessment: Needs assistance Sitting-balance support: No upper extremity supported, Feet supported Sitting balance-Leahy Scale: Normal     Standing balance support: No upper extremity supported Standing balance-Leahy Scale: Fair      Standardized Balance Assessment Standardized Balance Assessment : Dynamic Gait Index   Dynamic Gait Index Level Surface: Mild Impairment Change in Gait Speed: Mild Impairment Gait with Horizontal Head Turns: Moderate Impairment Gait with Vertical Head Turns: Mild Impairment Gait and Pivot Turn: Normal Step Over Obstacle: Normal Step Around Obstacles: Mild Impairment Steps: Moderate Impairment Total Score: 16      Communication Communication Communication: No apparent difficulties  Cognition Arousal: Alert Behavior During Therapy: WFL for tasks assessed/performed   PT - Cognitive impairments: No apparent impairments    Following commands: Intact      Cueing Cueing Techniques: Verbal cues         Pertinent Vitals/Pain Pain Assessment Pain Assessment: Faces Faces Pain Scale: Hurts  whole lot Pain Location: h/a Pain Descriptors / Indicators: Headache Pain Intervention(s): Limited activity within patient's tolerance, Monitored during session, Repositioned, RN gave pain meds during session           PT Goals (current goals can now be found in the care plan section) Acute Rehab PT Goals PT Goal Formulation: With patient Time For Goal Achievement: 07/06/24 Potential to Achieve Goals: Good Progress towards PT goals: Progressing toward goals    Frequency    Min 2X/week       AM-PAC PT 6 Clicks Mobility   Outcome Measure  Help needed turning from your back to your side while in a flat bed without using bedrails?: None Help needed moving from lying on your back to sitting on the side of a flat bed without using bedrails?: None Help needed moving to and from a bed to a chair (including a wheelchair)?: A Little Help needed standing up from a chair using your arms (e.g., wheelchair or bedside chair)?: None Help needed to walk in hospital room?: A Little Help needed climbing 3-5 steps with a railing? : A Little 6 Click Score: 21    End of Session Equipment Utilized During Treatment: Gait belt Activity Tolerance: Patient tolerated treatment well Patient left: in bed;with call bell/phone within reach;with family/visitor present Nurse Communication: Mobility status PT Visit Diagnosis: Other abnormalities of gait and mobility (R26.89)     Time: 8950-8887 PT Time Calculation (min) (ACUTE ONLY): 23 min  Charges:    $Gait Training: 8-22 mins $Therapeutic Activity: 8-22 mins PT General Charges $$ ACUTE PT VISIT: 1 Visit                    Kate ORN, PT, DPT Secure Chat Preferred  Rehab Office [phone]   Kate BRAVO Wendolyn 06/23/2024, 12:45 PM

## 2024-06-26 ENCOUNTER — Encounter (HOSPITAL_COMMUNITY): Payer: Self-pay | Admitting: Cardiovascular Disease

## 2024-06-26 NOTE — Progress Notes (Signed)
 [facility] Transitional Care Management     Situation   Kendra [name] is a 73 y.o. female who was contacted today for a transitional care outreach.  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 Yes  Background  First Call: Unsuccessful Unsuccessful: Left message First call date: 06/26/2024 First Call Notes: HN left msg on VM, HN left direct # to reach this HN and advised need for [facility], left TCM # of [phone].  Second Call:  Unsuccessful Unsuccessful:  No answer Second call date:  06/27/2024, CD also reached out to son and he reports he will talk to pt and call bk.   Since Discharge: unable to reach  I reviewed current EMR med list and facility discharge medications. Please see note in EMR med list for findings. (As noted below)  Copied below from [facility] DC summary Brief/Interim Summary: Patient is a 72 year old female with history of hypertension, fibromyalgia, sleep apnea, GERD, migraine headache who presented with complaint of headache, numbness in the right hand. On presentation, she was hypertensive. Lab work showed elevated troponin. CT scan of the head did not show any acute findings but showed remote infarction in the right periventricular white matter and left thalamus. MRI of the brain showed scattered infarcts : punctate acute infarcts in the right middle cerebellar peduncle , right cerebellar hemisphere, small acute supratentorial infarcts involve the splenium of the corpus callosum on the left and left parietal and posterior left frontal cortex and subcortical white matter.Patient admitted for the stroke workup. Stroke workup completed. She underwent  implantable loop recorder placement. Medically stable for discharge home today.  Following problems were addressed during the hospitalization:  Acute CVA: Presented with right-sided headache, numbness on the right 4th and 5th fingers.  CT scan of the head did not show any acute findings but showed remote infarction in the right periventricular white matter and left thalamus.  MRI of the brain showed scattered infarcts : punctate acute infarcts in the right middle cerebellar peduncle , right cerebellar hemisphere, small acute supratentorial infarcts involve the splenium of the corpus callosum on the left and left parietal and posterior left frontal cortex and subcortical white matter.  Neurology consulted. CTA head and neck did not show any large vessel occlusion. LDL of 165, A1c of 5.5.  Lower extremity Doppler negative for DVT echocardiogram showed EF of 60 to 65% no atrial level shunt.  PT/OT/speech consulted, recommended outpatient follow-up. Allergic to aspirin so on Plavix only. EEG is negative for seizures. She will follow-up with neurology as an outpatient. Patient is ambulatory at baseline and works 3 times a week.  Elevated troponin: Denies chest pain. Likely demand ischemia secondary to hypertensive emergency, stroke. Echo did not show any regional wall motion abnormality. Cardiology was following and recommended outpatient workup for ischemic evaluation  Hyperlipidemia: LDL of 165. CT angiogram of the head and neck showed aortic atherosclerosis, minimal carotid plaques. Continue Lipitor 40 mg daily  History of hypertension: Takes valsartan, amlodipine at home. as per report, she was not compliant with medications. This medication will be continued  History of migraine headaches: Was having right-sided headache on presentation. Given Tylenol , Benadryl , Reglan  in the ED. He needs to follow-up with neurology as an outpatient for the management of chronic  migraine  History of  neuropathy/fibromyalgia: On gabapentin   GERD/hiatal hernia: Currently not on any medications  Obesity: BMI 38.2  Discharge Diagnoses:  Principal Problem: CVA (cerebral vascular accident) (HCC) Active Problems: Elevated troponin Essential hypertension Hyperlipidemia History of migraine headaches Neuropathy Obesity, Class II, BMI 35-39.9 Hiatal hernia with GERD  Discharge Instructions  Discharge Instructions   Ambulatory referral to Neurology Complete by: As directed  An appointment is requested in approximately: 4 weeks  Ambulatory referral to Occupational Therapy Complete by: As directed  Ambulatory referral to Physical Therapy Complete by: As directed  Ambulatory referral to Speech Therapy Complete by: As directed  Diet - low sodium heart healthy Complete by: As directed  Discharge instructions Complete by: As directed  1)Please take your medications as instructed 2)Follow up with your PCP next week 3)Follow up with neurology in 4 weeks. Name and number of the provider group has been attached.  Increase activity slowly Complete by: As directed  Medication List    STOP taking these medications  enoxaparin  40 MG/0.4ML injection-Commonly known as: LOVENOX  valsartan 160 MG tablet-Commonly known as: DIOVAN  TAKE these medications   acetaminophen  325 MG tablet Commonly known as: TYLENOL  Take 500 mg by mouth every 6 (six) hours as needed for mild pain (pain score 1-3).  amLODipine 5 MG tablet Commonly known as: NORVASC Take 1 tablet (5 mg total) by mouth at bedtime.  ascorbic acid 500 MG tablet Commonly known as: VITAMIN C Take 500 mg by mouth in the morning.  atorvastatin 40 MG tablet Commonly known as: LIPITOR Take 1 tablet (40 mg total) by mouth daily. Start taking on: June 24, 2024 What changed:  medication strength how much to take when to take this  BACLOFEN PO Take 1 tablet by mouth 2 (two) times daily as needed (for muscle  spasms).  budesonide-formoterol 160-4.5 MCG/ACT inhaler Commonly known as: SYMBICORT Inhale 2 puffs into the lungs 2 (two) times daily as needed (for flares).  butalbital-acetaminophen -caffeine 50-325-40 MG tablet Commonly known as: FIORICET Take 1 tablet by mouth every 4 (four) hours as needed for headache or migraine.  clopidogrel 75 MG tablet Commonly known as: PLAVIX Take 1 tablet (75 mg total) by mouth daily. Start taking on: June 24, 2024  ELDERBERRY PO Take 1 capsule by mouth daily.  fluticasone 50 MCG/ACT nasal spray Commonly known as: FLONASE Place 1 spray into both nostrils 2 (two) times daily as needed for allergies or rhinitis.  gabapentin  400 MG capsule Commonly known as: NEURONTIN  Take 400 mg by mouth 3 (three) times daily.  irbesartan 75 MG tablet Commonly known as: AVAPRO Take 1 tablet (75 mg total) by mouth daily. Start taking on: June 24, 2024  ondansetron  4 MG tablet Commonly known as: Zofran  Take 1 tablet (4 mg total) by mouth daily as needed for nausea or vomiting.  ONE-A-DAY WOMENS PO Take 1 tablet by mouth daily with breakfast.  OVER THE COUNTER MEDICATION Take 1 tablet by mouth See admin instructions. Calcium/Zinc tablet- Take 1 tablet by mouth once a day  pantoprazole  40 MG tablet Commonly known as: PROTONIX  Take 40 mg by mouth 2 (two) times daily.  SLOW FE PO Take 1 tablet by mouth daily with breakfast.  Vitamin B-12 500 MCG Subl Place 500 mcg under the tongue daily.  Vitamin D3 50 MCG (2000 UT) Tabs Take 4,000 Units by mouth daily.  Follow-up Information   [facility] Follow up.  Why: [facility] will contact you for the first appointment Contact information: [address] [place], [place] [address] [phone]   Primary Care Provider on Record: Maida Sherrilyn Lunger, PA-C   Assessment    General Assessment     None       Recommendation    PCP/specialist notified: Yes  Referral Made: No  Referrals  made to other disciplines: None   No future appointments.   Collier Mulch, RN  [facility] Navigator [phone]    Electronically signed by: Collier Stacia Mulch, RN 06/26/2024 2:37 PM

## 2024-06-30 ENCOUNTER — Telehealth: Payer: Self-pay | Admitting: Internal Medicine

## 2024-06-30 ENCOUNTER — Telehealth: Payer: Self-pay | Admitting: Neurology

## 2024-06-30 NOTE — Telephone Encounter (Signed)
 Patient calling about tape from her incision coming off and also can she still get a mammogram done. Please advise

## 2024-06-30 NOTE — Telephone Encounter (Signed)
 Returned call to Pt.  Advised ok that steristrips were falling off at this point.  Advised loop insertion site should be well healed at this time and she can remove strips if she would like.  Advised ok to have a mammogram, advised to let her office performing mammogram know so they can use care.  All questions answered.  Pt thanked nurse for call back.

## 2024-06-30 NOTE — Telephone Encounter (Signed)
 On call MD [facility] answer service :   Dear Dr Nancey,   Patient of Dr Leopoldo, [facility] NEUROLOGY-  not a [facility] patient.   On 1o-15-25  admitted to [facility] for embolic Stroke and had a loop recorder implanted on 06-23-2024.  She states she has received no instructions .  She is asking about the bandage and when she may take it off?  I suspect that a week is enough, but want to make sure.  TY

## 2024-07-03 NOTE — Telephone Encounter (Signed)
 Spoke with pt and advised she may remove bandage from loop recorder implanted on 06/23/2024.  Pt states she removed bandage on Friday 06/30/2024.

## 2024-07-05 ENCOUNTER — Telehealth: Payer: Self-pay | Admitting: Adult Health

## 2024-07-05 NOTE — Telephone Encounter (Signed)
 Appointment details confirmed for pt

## 2024-07-20 ENCOUNTER — Emergency Department (HOSPITAL_COMMUNITY)

## 2024-07-20 ENCOUNTER — Emergency Department (HOSPITAL_COMMUNITY): Admission: EM | Admit: 2024-07-20 | Discharge: 2024-07-20 | Disposition: A | Discharge status: Home / Self Care

## 2024-07-20 DIAGNOSIS — G459 Transient cerebral ischemic attack, unspecified: Secondary | ICD-10-CM | POA: Diagnosis not present

## 2024-07-20 DIAGNOSIS — Z79899 Other long term (current) drug therapy: Secondary | ICD-10-CM | POA: Diagnosis not present

## 2024-07-20 DIAGNOSIS — R4701 Aphasia: Secondary | ICD-10-CM | POA: Diagnosis present

## 2024-07-20 DIAGNOSIS — I10 Essential (primary) hypertension: Secondary | ICD-10-CM | POA: Diagnosis not present

## 2024-07-20 DIAGNOSIS — Z7902 Long term (current) use of antithrombotics/antiplatelets: Secondary | ICD-10-CM | POA: Insufficient documentation

## 2024-07-20 LAB — CBC
HCT: 40.7 % (ref 36.0–46.0)
Hemoglobin: 12.9 g/dL (ref 12.0–15.0)
MCH: 30.3 pg (ref 26.0–34.0)
MCHC: 31.7 g/dL (ref 30.0–36.0)
MCV: 95.5 fL (ref 80.0–100.0)
Platelets: 242 K/uL (ref 150–400)
RBC: 4.26 MIL/uL (ref 3.87–5.11)
RDW: 12.8 % (ref 11.5–15.5)
WBC: 9.8 K/uL (ref 4.0–10.5)
nRBC: 0 % (ref 0.0–0.2)

## 2024-07-20 LAB — DIFFERENTIAL
Abs Immature Granulocytes: 0.04 K/uL (ref 0.00–0.07)
Basophils Absolute: 0 K/uL (ref 0.0–0.1)
Basophils Relative: 0 %
Eosinophils Absolute: 0.1 K/uL (ref 0.0–0.5)
Eosinophils Relative: 1 %
Immature Granulocytes: 0 %
Lymphocytes Relative: 20 %
Lymphs Abs: 2 K/uL (ref 0.7–4.0)
Monocytes Absolute: 1 K/uL (ref 0.1–1.0)
Monocytes Relative: 10 %
Neutro Abs: 6.6 K/uL (ref 1.7–7.7)
Neutrophils Relative %: 69 %

## 2024-07-20 LAB — COMPREHENSIVE METABOLIC PANEL WITH GFR
ALT: 16 U/L (ref 0–44)
AST: 20 U/L (ref 15–41)
Albumin: 3.4 g/dL — ABNORMAL LOW (ref 3.5–5.0)
Alkaline Phosphatase: 77 U/L (ref 38–126)
Anion gap: 11 (ref 5–15)
BUN: 18 mg/dL (ref 8–23)
CO2: 27 mmol/L (ref 22–32)
Calcium: 8.8 mg/dL — ABNORMAL LOW (ref 8.9–10.3)
Chloride: 103 mmol/L (ref 98–111)
Creatinine, Ser: 0.94 mg/dL (ref 0.44–1.00)
GFR, Estimated: >60 mL/min (ref >60)
Glucose, Bld: 104 mg/dL — ABNORMAL HIGH (ref 70–99)
Potassium: 4 mmol/L (ref 3.5–5.1)
Sodium: 141 mmol/L (ref 135–145)
Total Bilirubin: 0.7 mg/dL (ref 0.0–1.2)
Total Protein: 6.4 g/dL — ABNORMAL LOW (ref 6.5–8.1)

## 2024-07-20 LAB — I-STAT CHEM 8, ED
BUN: 20 mg/dL (ref 8–23)
Calcium, Ion: 1.16 mmol/L (ref 1.15–1.40)
Chloride: 102 mmol/L (ref 98–111)
Creatinine, Ser: 1 mg/dL (ref 0.44–1.00)
Glucose, Bld: 100 mg/dL — ABNORMAL HIGH (ref 70–99)
HCT: 41 % (ref 36.0–46.0)
Hemoglobin: 13.9 g/dL (ref 12.0–15.0)
Potassium: 4 mmol/L (ref 3.5–5.1)
Sodium: 140 mmol/L (ref 135–145)
TCO2: 27 mmol/L (ref 22–32)

## 2024-07-20 LAB — PROTIME-INR
INR: 1 (ref 0.8–1.2)
Prothrombin Time: 13.2 s (ref 11.4–15.2)

## 2024-07-20 LAB — ETHANOL: Alcohol, Ethyl (B): <15 mg/dL (ref <15)

## 2024-07-20 LAB — CBG MONITORING, ED
Glucose-Capillary: 97 mg/dL (ref 70–99)
Glucose-Capillary: 99 mg/dL (ref 70–99)

## 2024-07-20 LAB — APTT: aPTT: 26 s (ref 24–36)

## 2024-07-20 MED ORDER — SODIUM CHLORIDE 0.9% FLUSH
3.0000 mL | Freq: Once | INTRAVENOUS | Status: AC
  Administered 2024-07-20: 3 mL via INTRAVENOUS

## 2024-07-20 NOTE — ED Provider Notes (Addendum)
 [facility] AT [facility] Provider Note   CSN: [id number] Arrival date & time: 07/20/24  1219     Patient presents with: No chief complaint on file.   Kendra [name] is a 73 y.o. female.   73 year old female with past medical history of recent CVA with no residual deficits as well as hypertension presenting to the emergency department today after a 5-minute episode of aphasia.  The patient was in the car with her family when she apparently had a 5-minute episode of aphasia.  She apparently did this and does not remember any of this.  She states that she is feeling back to normal now.  She denies any complaints other than some pain over her right lower ribs where she fell a few days ago.  She tripped and fell and struck her lower ribs on her elbow when she went to catch herself.  She states she has had some mild persistent pain since then.  Denies any midsternal or left-sided chest pain.  States the pain is very reproducible here on exam.  She denies any focal weakness, numbness, or tingling currently.  Denies any headache.        Prior to Admission medications   Medication Sig Start Date End Date Taking? Authorizing Provider  acetaminophen  (TYLENOL ) 325 MG tablet Take 500 mg by mouth every 6 (six) hours as needed for mild pain (pain score 1-3).    [provider]  amLODipine (NORVASC) 5 MG tablet Take 1 tablet (5 mg total) by mouth at bedtime. 06/23/24   Jillian Buttery, MD  ascorbic acid (VITAMIN C) 500 MG tablet Take 500 mg by mouth in the morning.    [provider]  atorvastatin (LIPITOR) 40 MG tablet Take 1 tablet (40 mg total) by mouth daily. 06/24/24   Adhikari, Amrit, MD  BACLOFEN PO Take 1 tablet by mouth 2 (two) times daily as needed (for muscle spasms). Patient not taking: Reported on 06/21/2024    [provider]  budesonide-formoterol (SYMBICORT) 160-4.5 MCG/ACT inhaler Inhale 2 puffs into the lungs 2 (two) times  daily as needed (for flares).  Patient not taking: Reported on 06/21/2024 11/28/19   [provider]  butalbital-acetaminophen -caffeine (FIORICET) 50-325-40 MG tablet Take 1 tablet by mouth every 4 (four) hours as needed for headache or migraine. 06/23/24   Jillian Buttery, MD  Cholecalciferol (VITAMIN D3) 50 MCG (2000 UT) TABS Take 4,000 Units by mouth daily.    [provider]  clopidogrel (PLAVIX) 75 MG tablet Take 1 tablet (75 mg total) by mouth daily. 06/24/24   Adhikari, Amrit, MD  Cyanocobalamin (VITAMIN B-12) 500 MCG SUBL Place 500 mcg under the tongue daily.    [provider]  ELDERBERRY PO Take 1 capsule by mouth daily. Patient not taking: Reported on 06/21/2024    [provider]  Ferrous Sulfate  (SLOW FE PO) Take 1 tablet by mouth daily with breakfast.    [provider]  fluticasone (FLONASE) 50 MCG/ACT nasal spray Place 1 spray into both nostrils 2 (two) times daily as needed for allergies or rhinitis. 02/15/20   [provider]  gabapentin  (NEURONTIN ) 400 MG capsule Take 400 mg by mouth 3 (three) times daily.    [provider]  irbesartan (AVAPRO) 75 MG tablet Take 1 tablet (75 mg total) by mouth daily. 06/24/24   Jillian Buttery, MD  Multiple Vitamins-Minerals (ONE-A-DAY WOMENS PO) Take 1 tablet by mouth daily with breakfast.    [provider]  ondansetron  (ZOFRAN ) 4 MG tablet Take 1 tablet (4 mg total) by mouth daily as needed for nausea or vomiting. 06/23/24 06/23/25  Jillian Buttery, MD  OVER THE COUNTER MEDICATION Take 1 tablet by mouth See admin instructions. Calcium/Zinc tablet- Take 1 tablet by mouth once a day    [provider]  pantoprazole  (PROTONIX ) 40 MG tablet Take 40 mg by mouth 2 (two) times daily. Patient not taking: Reported on 06/21/2024    [provider]    Allergies: Ibuprofen, Nsaids, Other, Codeine, Hydrocodone-acetaminophen , Hydromorphone , Morphine  and codeine,  Oxycodone , Propoxyphene, Tizanidine, Tramadol hcl, and Prednisone    Review of Systems  Neurological:  Positive for speech difficulty.  All other systems reviewed and are negative.   Updated Vital Signs BP (!) 165/74 (BP Location: Left Arm)   Pulse 76   Temp 98 F (36.7 C) (Oral)   Resp 18   SpO2 100%   Physical Exam Vitals and nursing note reviewed.   Gen: NAD Eyes: PERRL, EOMI HEENT: no oropharyngeal swelling Neck: trachea midline Resp: clear to auscultation bilaterally, tender over the lower right lateral ribs with no crepitus noted and no underlying abdominal tenderness noted Card: RRR, no murmurs, rubs, or gallops Abd: nontender, nondistended Extremities: no calf tenderness, no edema Vascular: 2+ radial pulses bilaterally, 2+ DP pulses bilaterally Neuro: NIH stroke scale of 0 Skin: no rashes Psyc: acting appropriately   (all labs ordered are listed, but only abnormal results are displayed) Labs Reviewed  COMPREHENSIVE METABOLIC PANEL WITH GFR - Abnormal; Notable for the following components:      Result Value   Glucose, Bld 104 (*)    Calcium 8.8 (*)    Total Protein 6.4 (*)    Albumin 3.4 (*)    All other components within normal limits  I-STAT CHEM 8, ED - Abnormal; Notable for the following components:   Glucose, Bld 100 (*)    All other components within normal limits  PROTIME-INR  APTT  CBC  DIFFERENTIAL  ETHANOL  CBG MONITORING, ED  CBG MONITORING, ED    EKG: None  Radiology: MR BRAIN WO CONTRAST Result Date: 07/20/2024 EXAM: MRI BRAIN WITHOUT CONTRAST 07/20/2024 02:43:40 PM TECHNIQUE: Multiplanar multisequence MRI of the head/brain was performed without the administration of intravenous contrast. COMPARISON: MRI head and CTA head and neck 06/21/2024. CLINICAL HISTORY: Neuro deficit, acute, stroke suspected. FINDINGS: BRAIN AND VENTRICLES: Interval resolution of diffusion signal abnormality at the prior sites of infarct. There are no new areas of  acute infarct appreciated. No intracranial hemorrhage. Faint susceptibility at the site of remote infarct in the genu of the corpus callosum suggestive of prior hemorrhage. No mass. No midline shift. No hydrocephalus. Encephalomalacia and gliosis in the left aspect of the splenium of the corpus callosum with T2 shine through on diffusion at the site of prior infarct. Similar appearance of mild chronic microvascular ischemic changes. Mild age related parenchymal volume loss. Remote lacunar infarcts in the basal ganglia and left thalamus. Additional remote infarct noted in the right genu of the corpus callosum which is unchanged from prior. The sella is unremarkable. Normal flow voids. ORBITS: No acute abnormality. SINUSES AND MASTOIDS: No acute abnormality. BONES AND SOFT TISSUES: Normal marrow signal. No acute soft tissue abnormality. IMPRESSION: 1. No acute infarct. 2. Encephalomalacia and gliosis in the left aspect of the splenium of the corpus callosum consistent with prior infarct. 3. Mild chronic microvascular ischemic changes. Mild age-related parenchymal volume loss. 4. Remote lacunar infarcts in the basal ganglia and  left thalamus. 5. Remote infarct in the right genu of the corpus callosum, unchanged from prior, with faint susceptibility suggestive of prior hemorrhage. Electronically signed by: Donnice Mania MD 07/20/2024 03:21 PM EST RP Workstation: [id number]   DG Chest Portable 1 View Result Date: 07/20/2024 EXAM: 1 VIEW(S) XRAY OF THE CHEST 07/20/2024 01:24:00 PM COMPARISON: 06/21/2024 CLINICAL HISTORY: CP, fall FINDINGS: LUNGS AND PLEURA: No focal pulmonary opacity. No pleural effusion. No pneumothorax. HEART AND MEDIASTINUM: Large hiatal hernia is noted. BONES AND SOFT TISSUES: No acute osseous abnormality. IMPRESSION: 1. No acute cardiopulmonary process. 2. Large hiatal hernia. Electronically signed by: Lynwood Seip MD 07/20/2024 02:03 PM EST RP Workstation: [id number]     Procedures    Medications Ordered in the ED  sodium chloride  flush (NS) 0.9 % injection 3 mL (3 mLs Intravenous Given 07/20/24 1314)                                    Medical Decision Making 73 year old female with past medical history of multiple strokes with no residual deficits in the past as well as hypertension presenting to the emergency department today after a 5-minute episode of aphasia which has since resolved.  I will further evaluate the patient here with basic labs as well as a chest x-ray to evaluate for rib fracture or pneumothorax.  With the patient symptoms resolving we will order an MRI to evaluate for any new strokes.  Looks like her last MRI showed some thromboembolic strokes.  Will look for any new evidence of thromboembolic events which would necessitate further evaluation in the hospital.  If her workup is unremarkable we will discuss her case with neurology to see if she will require admission or could follow-up as an outpatient since she does seem to be optimized medically.  The patient's labs here are reassuring.  MRI does not show any acute findings.  The patient remained asymptomatic here.  Her case is discussed with Dr. Michaela.  He recommended trying to interrogate her loop recorder if possible.  I did call and discussed this with Trish from cardiology who will try to help get this arranged.  Plan is for discharge afterwards with or without anticoagulation depending on loop recorder results.  If we are not able to them.  Plan is for discharge to follow-up with cardiology as an outpatient regarding the loop recorder results.  The patient did not have any arrhythmias or events on her monitor.  She is discharged with return precautions.  CRITICAL CARE Performed by: Prentice JONELLE Medicus   Total critical care time: 30 minutes  Critical care time was exclusive of separately billable procedures and treating other patients.  Critical care was necessary to treat or prevent imminent or  life-threatening deterioration.  Critical care was time spent personally by me on the following activities: development of treatment plan with patient and/or surrogate as well as nursing, discussions with consultants, evaluation of patient's response to treatment, examination of patient, obtaining history from patient or surrogate, ordering and performing treatments and interventions, ordering and review of laboratory studies, ordering and review of radiographic studies, pulse oximetry and re-evaluation of patient's condition.   Amount and/or Complexity of Data Reviewed Labs: ordered. Radiology: ordered.        Final diagnoses:  TIA (transient ischemic attack)    ED Discharge Orders     None          Medicus Prentice JONELLE,  MD 07/20/24 1617    Ula Prentice SAUNDERS, MD 07/20/24 [phone]

## 2024-07-20 NOTE — ED Triage Notes (Signed)
 PT BIB [facility].  Pt here for 10/154 and dx with stroke with mulitple infarcts.  No deficits from that stroke.  While riding in car today a family member observed her appearing aphasic and altered lasting about 5 minutes.  Symptoms had resolved on EMS arrival.  Pt endorses hx of silent migraines and is endorsing some bright spots in her vision at this time.   122/64 CBG 132, RR 18, HR 88 NS, 96% RA

## 2024-07-20 NOTE — ED Notes (Signed)
 CCMD called.

## 2024-07-20 NOTE — Discharge Instructions (Signed)
 Your workup today was reassuring.  Our neurologist reviewed your records and with your symptoms resolving there is no indication for readmission.  Please follow-up with your doctor and return to the ER for worsening symptoms.

## 2024-07-24 ENCOUNTER — Ambulatory Visit: Attending: Cardiovascular Disease

## 2024-07-24 DIAGNOSIS — R7989 Other specified abnormal findings of blood chemistry: Secondary | ICD-10-CM | POA: Diagnosis not present

## 2024-07-25 LAB — CUP PACEART REMOTE DEVICE CHECK
Date Time Interrogation Session: 20251117135110
Implantable Pulse Generator Implant Date: 20251017

## 2024-07-26 NOTE — Progress Notes (Signed)
 Remote Loop Recorder Transmission

## 2024-07-26 NOTE — Progress Notes (Signed)
 [facility] post TCM follow up 30 day  Date of call: 07/26/24 First Call:  Successful Successful:  Talked with patient First Call Date:  07/26/2024  Discharged from:  Admission Date: 07/20/24 ED Discharge Date:ED   Institution: [facility]       Diagnosis:  TIA  Admission Date: 06/21/24 Discharge Date: 06/23/24 Institution: [facility]           Diagnosis:  Principal Problem: CVA (cerebral vascular accident) [facility])   Active Problems: Elevated troponin Essential hypertension Hyperlipidemia History of migraine headaches Neuropathy Obesity, Class II, BMI 35-39.9 Hiatal hernia with GERD   Updates/Changes since last encounter: HN spoke to pt and the following is reported: - Pt reports last TIA 11/13 or 11/14- pt will see Neuro Provider 11/24. Pt very aware to monitor for abn s/sx, call or seek medical care prn. At this time pt denies needs. Pt will call Cardio to inquire about small amt of movement in her loop recorded. Pt reports she may have to stop working due to TIA/CVA event, pt works 3d/week at [employer].   Current Questions/Concerns: as noted  Is patient candidate for Navigation: not at this time.   Collier Mulch, RN  [facility] Navigator [phone]   Electronically signed by: Collier Stacia Mulch, RN 07/26/2024 3:54 PM

## 2024-07-27 ENCOUNTER — Ambulatory Visit

## 2024-07-31 ENCOUNTER — Ambulatory Visit: Admitting: Adult Health

## 2024-07-31 ENCOUNTER — Encounter: Payer: Self-pay | Admitting: Adult Health

## 2024-07-31 VITALS — BP 139/78 | HR 70 | Ht 65.0 in | Wt 229.2 lb

## 2024-07-31 DIAGNOSIS — Z09 Encounter for follow-up examination after completed treatment for conditions other than malignant neoplasm: Secondary | ICD-10-CM

## 2024-07-31 DIAGNOSIS — Z95818 Presence of other cardiac implants and grafts: Secondary | ICD-10-CM | POA: Diagnosis not present

## 2024-07-31 DIAGNOSIS — R2990 Unspecified symptoms and signs involving the nervous system: Secondary | ICD-10-CM | POA: Diagnosis not present

## 2024-07-31 DIAGNOSIS — I639 Cerebral infarction, unspecified: Secondary | ICD-10-CM

## 2024-07-31 DIAGNOSIS — E785 Hyperlipidemia, unspecified: Secondary | ICD-10-CM

## 2024-07-31 NOTE — Progress Notes (Signed)
 [facility] [address] [place]. [place] [address] [phone]       HOSPITAL FOLLOW UP NOTE  [name] Link Date of Birth:  [birth year] Medical Record Number:  [id number]   Reason for Referral:  hospital stroke follow up    SUBJECTIVE:   CHIEF COMPLAINT:  Chief Complaint  Patient presents with   RM 8     Patient is here with son for stroke follow-up - has some concerns and mad a list. Would like to discuss CT Scan, driving, going back to work, and exercises. Recently had a TIA - felt off closed her eyes while in the ambulance and was speaking non coherently .      HPI:   Ms. Kendra [name] is a 73 y.o. female with history of HTN, OSA on CPAP, fibromyalgia, migraine with visual aura who presented to [facility] ED on 06/21/2024 with R side HA, R finger numbness and visual disturbance.  Stroke workup revealed multifocal bilateral small infarcts embolic pattern, concerning for cardioembolic source.  Further workup noted below.  Loop recorder placed prior to discharge.  Recommended clopidogrel  75 mg daily, no DAPT due to aspirin allergy.  Initiated atorvastatin  40 mg daily for LDL 165.  Therapy recommended outpatient PT.  Return to ED 11/13 after 5 minute episode of aphasia.  MRI brain no acute findings.  Loop recorder interrogated without evidence of A-fib.  Lab work overall benign.  Remained asymptomatic and was discharged home.    Today, 07/31/2024, patient is being seen for initial hospital follow-up accompanied by her son. Reports overall doing well since discharge. Denies any new stroke/TIA symptoms or recurrent episodes of aphasia. Does feel her vision has declined since her stroke but unsure if due to cataracts vs needing updated prescription lenses.  She is scheduled to see ophthalmology next week.  She does report some mild memory difficulty since her stroke. She has not yet returned back to driving or returned to work as a scientist, physiological at [employer]. She is  planning on returning back to work at the beginning of January. She lives alone but sons live close by and has been assisting with transportation. Son is concerned of her returning back to driving due to recent event. She does have chronic gait impairment which she attributes to chronic low back pain, denies worsening post stroke. She was evaluated by therapies at [facility] and no needs identified. Reports compliance on Plavix  and atorvastatin .  Blood pressure well-controlled.  Has follow-up with PCP on 12/8.      PERTINENT IMAGING  CT head no acute finding, old left thalamic, R periventricular WM infarcts. CTA head and neck unremarkable  MRI  Small acute supratentorial and infratentorial infarcts suggestive of emboli. Chronic infarcts in the genu of the CC on the right and left thalamus.  2D Echo EF 60-65% LE venous doppler no DVT EEG normal, visual disturbance most likely due to L splenium infarct LDL 165, TG 293 HgbA1c 5.5 UDS neg    ROS:   14 system review of systems performed and negative with exception of those listed in HPI  PMH:  Past Medical History:  Diagnosis Date   Arthritis    Complication of anesthesia    Family history of adverse reaction to anesthesia    Fibromyalgia    GERD (gastroesophageal reflux disease)    Hypertension    PONV (postoperative nausea and vomiting)    Sleep apnea    uses mouth guard    PSH:  Past Surgical History:  Procedure Laterality Date   ABDOMINAL HYSTERECTOMY  2001   CARTILAGE SURGERY     CESAREAN SECTION  1967   CHOLECYSTECTOMY  2005   ESOPHAGOGASTRODUODENOSCOPY (EGD) WITH PROPOFOL  N/A 08/16/2020   Procedure: ESOPHAGOGASTRODUODENOSCOPY (EGD) WITH PROPOFOL ;  Surgeon: Aneita Gwendlyn DASEN, MD;  Location: [facility] ENDOSCOPY;  Service: Endoscopy;  Laterality: N/A;   FOREIGN BODY REMOVAL N/A 08/16/2020   Procedure: FOREIGN BODY REMOVAL;  Surgeon: Aneita Gwendlyn DASEN, MD;  Location: [facility] ENDOSCOPY;  Service: Endoscopy;  Laterality: N/A;    HIP PINNING,CANNULATED Right 04/14/2020   Procedure: CANNULATED HIP PINNING;  Surgeon: Jerri Kay HERO, MD;  Location: [facility] OR;  Service: Orthopedics;  Laterality: Right;   LOOP RECORDER INSERTION N/A 06/23/2024   Procedure: LOOP RECORDER INSERTION;  Surgeon: Nancey Eulas BRAVO, MD;  Location: [facility] INVASIVE CV LAB;  Service: Cardiovascular;  Laterality: N/A;   MENISCUS REPAIR     TOTAL KNEE ARTHROPLASTY Right 09/12/2018   Procedure: TOTAL KNEE ARTHROPLASTY;  Surgeon: Rubie Kemps, MD;  Location: [facility] ORS;  Service: Orthopedics;  Laterality: Right;   TUBAL LIGATION  1984   WRIST SURGERY Right    Torn Ligament    Social History:  Social History   Socioeconomic History   Marital status: Widowed    Spouse name: Not on file   Number of children: 2   Years of education: 14   Highest education level: Bachelor's degree (e.g., BA, AB, BS)  Occupational History   Occupation: retired  Tobacco Use   Smoking status: Never   Smokeless tobacco: Never  Vaping Use   Vaping status: Never Used  Substance and Sexual Activity   Alcohol use: Never   Drug use: Never   Sexual activity: Not Currently  Other Topics Concern   Not on file  Social History Narrative   1 cup of decaf coffee, and 1/2 of a tiny can of soda 4oz weekly    Social Drivers of Corporate Investment Banker Strain: Not on file  Food Insecurity: Low Risk  (07/05/2024)   Received from [facility]   Hunger Vital Sign    Within the past 12 months, you worried that your food would run out before you got money to buy more: Never true    Within the past 12 months, the food you bought just didn't last and you didn't have money to get more. : Never true  Transportation Needs: No Transportation Needs (07/05/2024)   Received from [facility]    In the past 12 months, has lack of reliable transportation kept you from medical appointments, meetings, work or from getting things needed for daily living? : No  Physical Activity:  Not on file  Stress: Not on file  Social Connections: Moderately Integrated (06/21/2024)   Social Connection and Isolation Panel    Frequency of Communication with Friends and Family: More than three times a week    Frequency of Social Gatherings with Friends and Family: More than three times a week    Attends Religious Services: More than 4 times per year    Active Member of [employer] or Organizations: Yes    Attends Banker Meetings: More than 4 times per year    Marital Status: Widowed  Intimate Partner Violence: Not At Risk (06/21/2024)   Humiliation, Afraid, Rape, and Kick questionnaire    Fear of Current or Ex-Partner: No    Emotionally Abused: No    Physically Abused: No    Sexually Abused:  No    Family History: History reviewed. No pertinent family history.  Medications:   Current Outpatient Medications on File Prior to Visit  Medication Sig Dispense Refill   acetaminophen  (TYLENOL ) 325 MG tablet Take 500 mg by mouth every 6 (six) hours as needed for mild pain (pain score 1-3).     amLODipine  (NORVASC ) 5 MG tablet Take 1 tablet (5 mg total) by mouth at bedtime. 30 tablet 0   ascorbic acid (VITAMIN C) 500 MG tablet Take 500 mg by mouth in the morning.     atorvastatin  (LIPITOR) 40 MG tablet Take 1 tablet (40 mg total) by mouth daily. 60 tablet 0   butalbital -acetaminophen -caffeine  (FIORICET ) 50-325-40 MG tablet Take 1 tablet by mouth every 4 (four) hours as needed for headache or migraine. 14 tablet 0   Cholecalciferol (VITAMIN D3) 50 MCG (2000 UT) TABS Take 4,000 Units by mouth daily.     clopidogrel  (PLAVIX ) 75 MG tablet Take 1 tablet (75 mg total) by mouth daily. 60 tablet 0   Cyanocobalamin (VITAMIN B-12) 500 MCG SUBL Place 500 mcg under the tongue daily.     Ferrous Sulfate  (SLOW FE PO) Take 1 tablet by mouth daily with breakfast.     fluticasone (FLONASE) 50 MCG/ACT nasal spray Place 1 spray into both nostrils 2 (two) times daily as needed for allergies or  rhinitis.     gabapentin  (NEURONTIN ) 400 MG capsule Take 400 mg by mouth 3 (three) times daily.     irbesartan  (AVAPRO ) 75 MG tablet Take 1 tablet (75 mg total) by mouth daily. 30 tablet 0   Multiple Vitamins-Minerals (ONE-A-DAY WOMENS PO) Take 1 tablet by mouth daily with breakfast.     ondansetron  (ZOFRAN ) 4 MG tablet Take 1 tablet (4 mg total) by mouth daily as needed for nausea or vomiting. 20 tablet 0   OVER THE COUNTER MEDICATION Take 1 tablet by mouth See admin instructions. Calcium /Zinc tablet- Take 1 tablet by mouth once a day     pantoprazole  (PROTONIX ) 40 MG tablet Take 40 mg by mouth 2 (two) times daily.     BACLOFEN PO Take 1 tablet by mouth 2 (two) times daily as needed (for muscle spasms). (Patient not taking: Reported on 07/31/2024)     budesonide-formoterol (SYMBICORT) 160-4.5 MCG/ACT inhaler Inhale 2 puffs into the lungs 2 (two) times daily as needed (for flares).  (Patient not taking: Reported on 07/31/2024)     ELDERBERRY PO Take 1 capsule by mouth daily. (Patient not taking: Reported on 07/31/2024)     No current facility-administered medications on file prior to visit.    Allergies:   Allergies  Allergen Reactions   Ibuprofen Other (See Comments)    Concerns w/ Stomach bleeds   Nsaids Other (See Comments)    GI Issues   Other Nausea And Vomiting    General anesthesia- CAN TOLERATE ONLY PROPOFOL    Codeine Nausea And Vomiting        Hydrocodone-Acetaminophen  Nausea And Vomiting   Hydromorphone  Itching   Morphine  And Codeine Nausea And Vomiting and Other (See Comments)    Causes N/V after an extended period of time   Oxycodone  Other (See Comments)    Can tolerate a very low dose (with ginger ale and dry toast)   Propoxyphene Nausea And Vomiting   Tizanidine Other (See Comments)    made me feel weird   Tramadol Hcl Nausea And Vomiting and Other (See Comments)    Face got hot and vertigo- with 25 mg taken (  too)   Prednisone Rash      OBJECTIVE:  Physical  Exam  Vitals:   07/31/24 0953  BP: 139/78  Pulse: 70  SpO2: 99%  Weight: 229 lb 3.2 oz (104 kg)  Height: 5' 5 (1.651 m)   Body mass index is 38.14 kg/m. No results found.  General: well developed, well nourished, pleasant elderly Caucasian female, seated, in no evident distress  Neurologic Exam Mental Status: Awake and fully alert.  No evidence of aphasia or dysarthria.  Oriented to place and time. Recent memory mildly impaired and remote memory intact. Attention span, concentration and fund of knowledge appropriate. Mood and affect appropriate.  Cranial Nerves: Pupils equal, briskly reactive to light. Extraocular movements full without nystagmus. Visual fields full to confrontation. Hearing intact. Facial sensation intact. Face, tongue, palate moves normally and symmetrically.  Motor: Normal bulk and tone. Normal strength in all tested extremity muscles Sensory.: intact to touch , pinprick , position and vibratory sensation.  Coordination: Rapid alternating movements normal in all extremities. Finger-to-nose and heel-to-shin performed accurately bilaterally. Gait and Station: Arises from chair without difficulty. Stance is normal. Gait demonstrates antalgic gait without use of AD     NIHSS  0 Modified Rankin  2-3      ASSESSMENT: Kendra [name] is a 73 y.o. year old female with multifocal bilateral small infarcts on 06/21/2024, embolic pattern concerning for cardioembolic source s/p ILR.  Transient episode of aphasia 07/20/2024 suspected TIA. Vascular risk factors include HTN, HLD, OSA on CPAP and migraine with visual aura.      PLAN:  Cryptogenic stroke:  TIA: Residual deficit: mild cognitive impairment. Offered new referral for cognitive therapy but declines interest at this time. Discussed cognitive exercises at home. She plans on returning back to work and driving in beginning of January which I believe is reasonable as long as she remains stable and cleared by  ophthalmology perspective to return to driving.  Discussed graduated return to driving with additional information provided in AVS. If there are concerns with returning back to driving such as with multitasking or reaction time, would recommend formal driving evaluation Continue Plavix  and atorvastatin  (Lipitor) for secondary stroke prevention managed/prescribed by PCP.   Cardiology will continue to monitor loop recorder -scheduled for initial download 12/18.  Was interrogated 11/13 (after transient episode of aphasia) without evidence of A-fib Advised to call with any recurrent episodes of aphasia, currently suspect in setting of TIA but if episode should reoccur, would recommend further work up to rule out seizure. Prior EEG 06/2024 without evidence of seizures.  Reviewed MR brain imaging with patient and son per request  Discussed secondary stroke prevention measures and importance of close PCP follow up for aggressive stroke risk factor management including BP goal<130/90, and HLD with LDL goal<70 Stroke labs 06/2024: LDL 165, A1c 5.5 - would recommend repeat lipid panel with PCP at f/u visit and adjust statin dose if needed. I have gone over the pathophysiology of stroke, warning signs and symptoms, risk factors and their management in some detail with instructions to go to the closest emergency room for symptoms of concern.     No further recommendations from stroke standpoint and is close to being followed by PCP for stroke risk factor management.  She can follow-up here on an as-needed basis but advised to call with any questions or concerns in the future.   CC:  [facility] provider: Dr. Rosemarie PCP: Gladis Dannielle DEL, PA-C    I personally spent a total  of 65 minutes in the care of the patient today including preparing to see the patient, getting/reviewing separately obtained history, performing a medically appropriate exam/evaluation, counseling and educating, referring and communicating with other  health care professionals, documenting clinical information in the EHR, and independently interpreting results.  Harlene Bogaert, AGNP-BC  [facility] [address] [place], [place] [address]  Phone [phone] Fax [phone] Note: This document was prepared with digital dictation and possible smart phrase technology. Any transcriptional errors that result from this process are unintentional.

## 2024-07-31 NOTE — Progress Notes (Signed)
 I agree with the above plan

## 2024-07-31 NOTE — Patient Instructions (Signed)
 Follow up with the eye doctor next week as scheduled for evaluation of vision concerns. Can also discuss return to driving from their perspective.  If cleared by ophthalmology and when you feel like you are ready to return to driving, would recommend gradually returning back to ensure reaction time and multitasking is intact (further information below)  Cardiology will continue to monitor your loop recorder and will be called with any concerning findings  You can contact cardiology with any questions or concerns at [phone]  Continue clopidogrel  75 mg daily  and Lipitor  for secondary stroke prevention  Continue to follow up with PCP regarding blood pressure and cholesterol management  Maintain strict control of hypertension with blood pressure goal below 130/90, diabetes with hemoglobin A1c goal below 7.0 % and cholesterol with LDL cholesterol (bad cholesterol) goal below 70 mg/dL.   Signs of a Stroke? Follow the BEFAST method:  Balance Watch for a sudden loss of balance, trouble with coordination or vertigo Eyes Is there a sudden loss of vision in one or both eyes? Or double vision?  Face: Ask the person to smile. Does one side of the face droop or is it numb?  Arms: Ask the person to raise both arms. Does one arm drift downward? Is there weakness or numbness of a leg? Speech: Ask the person to repeat a simple phrase. Does the speech sound slurred/strange? Is the person confused ? Time: If you observe any of these signs, call 911.           Thank you for coming to see us  at [facility]. I hope we have been able to provide you high quality care today.  You may receive a patient satisfaction survey over the next few weeks. We would appreciate your feedback and comments so that we may continue to improve ourselves and the health of our patients.   RETURN TO DRIVING PLAN:   WITH THE SUPERVISION OF A LICENSED DRIVER, PLEASE DRIVE IN AN EMPTY PARKING LOT FOR AT  LEAST 2-3 TRIALS TO TEST REACTION TIME, VISION, USE OF EQUIPMENT IN CAR, ETC.   IF SUCCESSFUL WITH THE PARKING LOT DRIVING, PROCEED TO SUPERVISED DRIVING TRIALS IN YOUR NEIGHBORHOOD STREETS AT LOW TRAFFIC TIMES TO TEST OBSERVATION TO TRAFFIC SIGNALS, REACTION TIME, ETC. PLEASE ATTEMPT AT LEAST 2-3 TRIALS IN YOUR NEIGHBORHOOD.   IF NEIGHBORHOOD DRIVING IS SUCCESSFUL, YOU MAY PROCEED TO DRIVING IN BUSIER AREAS IN YOUR COMMUNITY WITH SUPERVISION OF A LICENSED DRIVER. PLEASE ATTEMPT AT LEAST 4-5 TRIALS.     Local Driver Evaluation Programs:   Comprehensive Evaluation: includes clinical and in vehicle behind the wheel testing by OCCUPATIONAL THERAPIST. Programs have varying levels of adaptive controls available for trial.   [employer], [place] [address] [place], [place]  [address] [phone] or [phone] [email or web address] Evaluator:  Cyndee Crompton, OT/CDRS/CDI/SCDCM/Low Vision Certification   [facility] [address] [place], [place] [address] [phone] [email or web address] Evaluators:  Clotilda Prescott, OT and Kate Dollar, OT      Clinical evaluations only:  Includes clinical testing, refers to other programs or local certified driving instructor for behind the wheel testing.   [facility] (outpatient Rehab) Medical Marguerite Pinal [address] [place], [place] [address] [phone] for scheduling [email or web address] Evaluators:  Burnard Lipoma, OT; Mallie Ellen, OT   Other area clinical evaluators available upon request including [facility], [facility] and [facility].  Resource List What is a Industrial/product Designer: Your Road Ahead - A Guide to [employer]  Evaluations http://www.thehartford.com/resources/mature-market-excellence/publications-on-aging   Association for Academic Librarian - Disability and Driving Fact Sheets http://www.aded.net/?page=510   Driving after a Brain Injury: Brain Injury Association of America vcshow.co.za?A=SearchResult&SearchID=9495675&ObjectID=2758842&ObjectType=35   Driving with Adaptive Equipment: Transport Planner Association https://aguirre-arnold.org/      Stroke Prevention Some medical conditions and lifestyle choices can lead to a higher risk for a stroke. You can help to prevent a stroke by eating healthy foods and exercising. It also helps to not smoke and to manage any health problems you may have. How can this condition affect me? A stroke is an emergency. It should be treated right away. A stroke can lead to brain damage or threaten your life. There is a better chance of surviving and getting better after a stroke if you get medical help right away. What can increase my risk? The following medical conditions may increase your risk of a stroke: Diseases of the heart and blood vessels (cardiovascular disease). High blood pressure (hypertension). Diabetes. High cholesterol. Sickle cell disease. Problems with blood clotting. Being very overweight. Sleeping problems (obstructivesleep apnea). Other risk factors include: Being older than age 33. A history of blood clots, stroke, or mini-stroke (TIA). Race, ethnic background, or a family history of stroke. Smoking or using tobacco products. Taking birth control pills, especially if you smoke. Heavy alcohol and drug use. Not being active. What actions can I take to prevent this? Manage your health conditions High  cholesterol. Eat a healthy diet. If this is not enough to manage your cholesterol, you may need to take medicines. Take medicines as told by your doctor. High blood pressure. Try to keep your blood pressure below 130/80. If your blood pressure cannot be managed through a healthy diet and regular exercise, you may need to take medicines. Take medicines as told by your doctor. Ask your doctor if you should check your blood pressure at home. Have your blood pressure checked every year. Diabetes. Eat a healthy diet and get regular exercise. If your blood sugar (glucose) cannot be managed through diet and exercise, you may need to take medicines. Take medicines as told by your doctor. Talk to your doctor about getting checked for sleeping problems. Signs of a problem can include: Snoring a lot. Feeling very tired. Make sure that you manage any other conditions you have. Nutrition  Follow instructions from your doctor about what to eat or drink. You may be told to: Eat and drink fewer calories each day. Limit how much salt (sodium) you use to 1,500 milligrams (mg) each day. Use only healthy fats for cooking, such as olive oil, canola oil, and sunflower oil. Eat healthy foods. To do this: Choose foods that are high in fiber. These include whole grains, and fresh fruits and vegetables. Eat at least 5 servings of fruits and vegetables a day. Try to fill one-half of your plate with fruits and vegetables at each meal. Choose low-fat (lean) proteins. These include low-fat cuts of meat, chicken without skin, fish, tofu, beans, and nuts. Eat low-fat dairy products. Avoid foods that: Are high in salt. Have saturated fat. Have trans fat. Have cholesterol. Are processed or pre-made. Count how many carbohydrates you eat and drink each day. Lifestyle If you drink alcohol: Limit how much you have to: 0-1 drink a day for women who are not pregnant. 0-2 drinks a day for men. Know  how much alcohol is  in your drink. In the [place] one drink equals one 12 oz bottle of beer ( ), one 5 oz glass of wine ( ), or one 1 oz glass of hard liquor (44mL). Do not smoke or use any products that have nicotine or tobacco. If you need help quitting, ask your doctor. Avoid secondhand smoke. Do not use drugs. Activity  Try to stay at a healthy weight. Get at least 30 minutes of exercise on most days, such as: Fast walking. Biking. Swimming. Medicines Take over-the-counter and prescription medicines only as told by your doctor. Avoid taking birth control pills. Talk to your doctor about the risks of taking birth control pills if: You are over 71 years old. You smoke. You get very bad headaches. You have had a blood clot. Where to find more information American Stroke Association: www.strokeassociation.org Get help right away if: You or a loved one has any signs of a stroke. BE FAST is an easy way to remember the warning signs: B - Balance. Dizziness, sudden trouble walking, or loss of balance. E - Eyes. Trouble seeing or a change in how you see. F - Face. Sudden weakness or loss of feeling of the face. The face or eyelid may droop on one side. A - Arms. Weakness or loss of feeling in an arm. This happens all of a sudden and most often on one side of the body. S - Speech. Sudden trouble speaking, slurred speech, or trouble understanding what people say. T - Time. Time to call emergency services. Write down what time symptoms started. You or a loved one has other signs of a stroke, such as: A sudden, very bad headache with no known cause. Feeling like you may vomit (nausea). Vomiting. A seizure. These symptoms may be an emergency. Get help right away. Call your local emergency services (911 in the [place] Do not wait to see if the symptoms will go away. Do not drive yourself to the hospital. Summary You can help to prevent a stroke by eating healthy, exercising, and not smoking. It also  helps to manage any health problems you have. Do not smoke or use any products that contain nicotine or tobacco. Get help right away if you or a loved one has any signs of a stroke. This information is not intended to replace advice given to you by your health care provider. Make sure you discuss any questions you have with your health care provider. Document Revised: 07/27/2022 Document Reviewed: 07/27/2022 Elsevier Patient Education  2024 [employer].

## 2024-08-04 ENCOUNTER — Emergency Department (HOSPITAL_COMMUNITY)

## 2024-08-04 ENCOUNTER — Emergency Department (HOSPITAL_COMMUNITY)
Admission: EM | Admit: 2024-08-04 | Discharge: 2024-08-04 | Disposition: A | Discharge status: Home / Self Care | Attending: Emergency Medicine | Admitting: Emergency Medicine

## 2024-08-04 ENCOUNTER — Encounter (HOSPITAL_COMMUNITY)
Admission: EM | Disposition: A | Discharge status: Home / Self Care | Payer: Self-pay | Source: Home / Self Care | Attending: Emergency Medicine

## 2024-08-04 ENCOUNTER — Other Ambulatory Visit: Payer: Self-pay

## 2024-08-04 ENCOUNTER — Encounter (HOSPITAL_COMMUNITY): Payer: Self-pay

## 2024-08-04 DIAGNOSIS — Z8673 Personal history of transient ischemic attack (TIA), and cerebral infarction without residual deficits: Secondary | ICD-10-CM | POA: Insufficient documentation

## 2024-08-04 DIAGNOSIS — K2289 Other specified disease of esophagus: Secondary | ICD-10-CM

## 2024-08-04 DIAGNOSIS — W44F3XA Food entering into or through a natural orifice, initial encounter: Secondary | ICD-10-CM | POA: Insufficient documentation

## 2024-08-04 DIAGNOSIS — G4730 Sleep apnea, unspecified: Secondary | ICD-10-CM | POA: Diagnosis not present

## 2024-08-04 DIAGNOSIS — M1990 Unspecified osteoarthritis, unspecified site: Secondary | ICD-10-CM | POA: Diagnosis not present

## 2024-08-04 DIAGNOSIS — M797 Fibromyalgia: Secondary | ICD-10-CM | POA: Diagnosis not present

## 2024-08-04 DIAGNOSIS — Z7902 Long term (current) use of antithrombotics/antiplatelets: Secondary | ICD-10-CM | POA: Insufficient documentation

## 2024-08-04 DIAGNOSIS — K2100 Gastro-esophageal reflux disease with esophagitis, without bleeding: Secondary | ICD-10-CM

## 2024-08-04 DIAGNOSIS — K449 Diaphragmatic hernia without obstruction or gangrene: Secondary | ICD-10-CM | POA: Insufficient documentation

## 2024-08-04 DIAGNOSIS — K56699 Other intestinal obstruction unspecified as to partial versus complete obstruction: Secondary | ICD-10-CM

## 2024-08-04 DIAGNOSIS — Z79899 Other long term (current) drug therapy: Secondary | ICD-10-CM | POA: Diagnosis not present

## 2024-08-04 DIAGNOSIS — I10 Essential (primary) hypertension: Secondary | ICD-10-CM | POA: Insufficient documentation

## 2024-08-04 DIAGNOSIS — R1310 Dysphagia, unspecified: Secondary | ICD-10-CM

## 2024-08-04 DIAGNOSIS — K222 Esophageal obstruction: Secondary | ICD-10-CM

## 2024-08-04 DIAGNOSIS — T18108A Unspecified foreign body in esophagus causing other injury, initial encounter: Secondary | ICD-10-CM | POA: Diagnosis present

## 2024-08-04 DIAGNOSIS — T18128A Food in esophagus causing other injury, initial encounter: Secondary | ICD-10-CM | POA: Insufficient documentation

## 2024-08-04 HISTORY — PX: ESOPHAGOGASTRODUODENOSCOPY: SHX5428

## 2024-08-04 LAB — BASIC METABOLIC PANEL WITH GFR
Anion gap: 13 (ref 5–15)
BUN: 13 mg/dL (ref 8–23)
CO2: 25 mmol/L (ref 22–32)
Calcium: 9.4 mg/dL (ref 8.9–10.3)
Chloride: 103 mmol/L (ref 98–111)
Creatinine, Ser: 1.01 mg/dL — ABNORMAL HIGH (ref 0.44–1.00)
GFR, Estimated: 59 mL/min — ABNORMAL LOW (ref >60)
Glucose, Bld: 115 mg/dL — ABNORMAL HIGH (ref 70–99)
Potassium: 4 mmol/L (ref 3.5–5.1)
Sodium: 141 mmol/L (ref 135–145)

## 2024-08-04 LAB — CBC
HCT: 43.9 % (ref 36.0–46.0)
Hemoglobin: 14 g/dL (ref 12.0–15.0)
MCH: 30 pg (ref 26.0–34.0)
MCHC: 31.9 g/dL (ref 30.0–36.0)
MCV: 94.2 fL (ref 80.0–100.0)
Platelets: 269 K/uL (ref 150–400)
RBC: 4.66 MIL/uL (ref 3.87–5.11)
RDW: 12.7 % (ref 11.5–15.5)
WBC: 9.1 K/uL (ref 4.0–10.5)
nRBC: 0 % (ref 0.0–0.2)

## 2024-08-04 SURGERY — EGD (ESOPHAGOGASTRODUODENOSCOPY)
Anesthesia: General

## 2024-08-04 MED ORDER — SODIUM CHLORIDE 0.9 % IV BOLUS
1000.0000 mL | Freq: Once | INTRAVENOUS | Status: AC
Start: 2024-08-04 — End: 2024-08-04
  Administered 2024-08-04: 1000 mL via INTRAVENOUS

## 2024-08-04 MED ORDER — ONDANSETRON HCL 4 MG/2ML IJ SOLN
INTRAMUSCULAR | Status: DC | PRN
  Administered 2024-08-04: 4 mg via INTRAVENOUS

## 2024-08-04 MED ORDER — DEXAMETHASONE SOD PHOSPHATE PF 10 MG/ML IJ SOLN
INTRAMUSCULAR | Status: DC | PRN
  Administered 2024-08-04: 4 mg via INTRAVENOUS

## 2024-08-04 MED ORDER — GLUCAGON HCL RDNA (DIAGNOSTIC) 1 MG IJ SOLR
1.0000 mg | Freq: Once | INTRAMUSCULAR | Status: AC
  Administered 2024-08-04: 1 mg via INTRAVENOUS
  Filled 2024-08-04: qty 1

## 2024-08-04 MED ORDER — PROPOFOL 10 MG/ML IV BOLUS
INTRAVENOUS | Status: DC | PRN
  Administered 2024-08-04: 150 mg via INTRAVENOUS

## 2024-08-04 MED ORDER — SODIUM CHLORIDE 0.9 % IV SOLN: INTRAVENOUS | Status: DC

## 2024-08-04 MED ORDER — ONDANSETRON HCL 4 MG/2ML IJ SOLN
4.0000 mg | Freq: Once | INTRAMUSCULAR | Status: AC
  Administered 2024-08-04: 4 mg via INTRAVENOUS
  Filled 2024-08-04: qty 2

## 2024-08-04 MED ORDER — SUCCINYLCHOLINE CHLORIDE 200 MG/10ML IV SOSY
PREFILLED_SYRINGE | INTRAVENOUS | Status: DC | PRN
  Administered 2024-08-04: 100 mg via INTRAVENOUS

## 2024-08-04 MED ORDER — PROPOFOL 500 MG/50ML IV EMUL
INTRAVENOUS | Status: DC | PRN
  Administered 2024-08-04: 150 ug/kg/min via INTRAVENOUS

## 2024-08-04 NOTE — ED Triage Notes (Signed)
 Patient arrives being unable to swallow since eating Thanksgiving lunch yesterday. Hx of esophageal stretching and is unable to tolerate anything PO, including her meds or any liquid.

## 2024-08-04 NOTE — ED Provider Notes (Signed)
 [facility] ENDOSCOPY Provider Note  CSN: [id number] Arrival date & time: 08/04/24 1041  Chief Complaint(s) No chief complaint on file.  HPI Kendra [name] is a 73 y.o. female with past medical history as [name], Kendra [name], Kendra [name], Kendra [name], Kendra [name], Kendra [name], Kendra [name]  Patient reports yesterday she was eating turkey for Thanksgiving and a piece of turkey got stuck in her throat.  Unable to swallow Kendra since then.  She has been having difficulty with her secretions and feels like it stuck in the back of her throat and she has spit them back up.  No difficulty breathing, no vomiting.  No abdominal pain or chest pain.  Reports similar circumstances a couple years ago that required endoscopic evaluation.  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 Authorizing Provider   acetaminophen  (TYLENOL ) 325 MG tablet Take 500 mg by mouth every 6 (six) hours as needed for mild pain (pain score 1-3).    [provider]  amLODipine  (NORVASC ) 5 MG tablet Take 1 tablet (5 mg total) by mouth at bedtime. 06/23/24   Jillian Buttery, MD  ascorbic acid (VITAMIN C) 500 MG tablet Take 500 mg by mouth in the morning.    [provider]  atorvastatin  (LIPITOR) 40 MG tablet Take 1 tablet (40 mg total) by mouth daily. 06/24/24   Adhikari, Amrit, MD  BACLOFEN PO Take 1 tablet by mouth 2 (two) times daily as needed (for muscle spasms). Patient not taking: Reported on 07/31/2024    [provider]  budesonide-formoterol (SYMBICORT) 160-4.5 MCG/ACT inhaler Inhale 2 puffs into the lungs 2 (two) times daily as needed (for flares).  Patient not taking: Reported on 07/31/2024 11/28/19   [provider]  butalbital -acetaminophen -caffeine  (FIORICET ) 50-325-40 MG tablet Take 1 tablet by mouth every 4 (four) hours as needed for headache or migraine. 06/23/24   Jillian Buttery, MD  Cholecalciferol (VITAMIN D3) 50 MCG (2000 UT) TABS Take 4,000 Units by mouth daily.    [provider]  clopidogrel  (PLAVIX ) 75 MG tablet Take 1 tablet (75 mg total) by mouth daily. 06/24/24   Adhikari, Amrit, MD  Cyanocobalamin (VITAMIN B-12) 500 MCG SUBL Place 500 mcg under the tongue daily.    [provider]  ELDERBERRY PO Take 1 capsule by mouth daily. Patient not taking: Reported on 07/31/2024    [provider]  Ferrous  Sulfate (SLOW FE PO) Take 1 tablet by mouth daily with breakfast.    [provider]  fluticasone (FLONASE) 50 MCG/ACT nasal spray Place 1 spray into both nostrils 2 (two) times daily as needed for allergies or rhinitis. 02/15/20   [provider]  gabapentin  (NEURONTIN ) 400 MG capsule Take 400 mg by mouth 3 (three) times daily.    [provider]  irbesartan  (AVAPRO ) 75 MG tablet Take 1 tablet (75 mg total) by  mouth daily. 06/24/24   Jillian Buttery, MD  Multiple Vitamins-Minerals (ONE-A-DAY WOMENS PO) Take 1 tablet by mouth daily with breakfast.    [provider]  ondansetron  (ZOFRAN ) 4 MG tablet Take 1 tablet (4 mg total) by mouth daily as needed for nausea or vomiting. 06/23/24 06/23/25  Jillian Buttery, MD  OVER THE COUNTER MEDICATION Take 1 tablet by mouth See admin instructions. Calcium /Zinc tablet- Take 1 tablet by mouth once a day    [provider]  pantoprazole  (PROTONIX ) 40 MG tablet Take 40 mg by mouth 2 (two) times daily.    [provider]                                                                                                                                    Past Surgical History Past Surgical History:  Procedure Laterality Date   ABDOMINAL HYSTERECTOMY  2001   CARTILAGE SURGERY     CESAREAN SECTION  1967   CHOLECYSTECTOMY  2005   ESOPHAGOGASTRODUODENOSCOPY (EGD) WITH PROPOFOL  N/A 08/16/2020   Procedure: ESOPHAGOGASTRODUODENOSCOPY (EGD) WITH PROPOFOL ;  Surgeon: Aneita Gwendlyn DASEN, MD;  Location: [facility] ENDOSCOPY;  Service: Endoscopy;  Laterality: N/A;   FOREIGN BODY REMOVAL N/A 08/16/2020   Procedure: FOREIGN BODY REMOVAL;  Surgeon: Aneita Gwendlyn DASEN, MD;  Location: [facility] ENDOSCOPY;  Service: Endoscopy;  Laterality: N/A;   HIP PINNING,CANNULATED Right 04/14/2020   Procedure: CANNULATED HIP PINNING;  Surgeon: Jerri Kay HERO, MD;  Location: [facility] OR;  Service: Orthopedics;  Laterality: Right;   LOOP RECORDER INSERTION N/A 06/23/2024   Procedure: LOOP RECORDER INSERTION;  Surgeon: Nancey Eulas BRAVO, MD;  Location: [facility] INVASIVE CV LAB;  Service: Cardiovascular;  Laterality: N/A;   MENISCUS REPAIR     TOTAL KNEE ARTHROPLASTY Right 09/12/2018   Procedure: TOTAL KNEE ARTHROPLASTY;  Surgeon: Rubie Kemps, MD;  Location: [facility] ORS;  Service: Orthopedics;  Laterality: Right;   TUBAL LIGATION  1984   WRIST SURGERY Right    Torn Ligament   Family History History  reviewed. No pertinent family history.  Social History Social History   Tobacco Use   Smoking status: Never   Smokeless tobacco: Never  Vaping Use   Vaping status: Never Used  Substance Use Topics   Alcohol use: Never   Drug use: Never   Allergies Ibuprofen, Nsaids, Other, Codeine, Hydrocodone-acetaminophen , Hydromorphone , Morphine  and codeine, Oxycodone , Propoxyphene, Tizanidine, Tramadol hcl, and Prednisone  Review of Systems A thorough review of  systems was obtained and all systems are negative except as noted in the HPI and PMH.   Physical Exam Vital Signs  I have reviewed the triage vital signs BP 127/67   Pulse 73   Temp (!) 97.1 F (36.2 C) (Temporal)   Resp (!) 26   Ht 5' 5 (1.651 m)   Wt 103.9 kg   SpO2 96%   BMI 38.11 kg/m  Physical Exam Vitals and nursing note reviewed.  Constitutional:      General: She is not in acute distress.    Appearance: Normal appearance.  HENT:     Head: Normocephalic and atraumatic.     Jaw: There is normal jaw occlusion.     Comments: No drooling stridor or trismus    Right Ear: External ear normal.     Left Ear: External ear normal.     Nose: Nose normal.     Mouth/Throat:     Mouth: Mucous membranes are moist.  Eyes:     General: No scleral icterus.       Right eye: No discharge.        Left eye: No discharge.  Neck:     Vascular: No carotid bruit.     Trachea: Trachea normal.  Cardiovascular:     Rate and Rhythm: Normal rate and regular rhythm.     Pulses: Normal pulses.     Heart sounds: Normal heart sounds.  Pulmonary:     Effort: Pulmonary effort is normal. No respiratory distress.     Breath sounds: Normal breath sounds. No stridor.  Abdominal:     General: Abdomen is flat. There is no distension.     Palpations: Abdomen is soft.     Tenderness: There is no abdominal tenderness.  Musculoskeletal:     Cervical back: No rigidity.     Right lower leg: No edema.     Left lower leg: No edema.  Skin:     General: Skin is warm and dry.     Capillary Refill: Capillary refill takes less than 2 seconds.  Neurological:     Mental Status: She is alert.  Psychiatric:        Mood and Affect: Mood normal.        Behavior: Behavior normal. Behavior is cooperative.     ED Results and Treatments Labs (all labs ordered are listed, but only abnormal results are displayed) Labs Reviewed  BASIC METABOLIC PANEL WITH GFR - Abnormal; Notable for the following components:      Result Value   Glucose, Bld 115 (*)    Creatinine, Ser 1.01 (*)    GFR, Estimated 59 (*)    All other components within normal limits  CBC                                                                                                                          Radiology No results found.  Pertinent labs & imaging results that were available  during my care of the patient were reviewed by me and considered in my medical decision making (see MDM for details).  Medications Ordered in ED Medications  0.9 %  sodium chloride  infusion ( Intravenous Stopped 08/04/24 1434)  glucagon  (human recombinant) (GLUCAGEN ) injection 1 mg (1 mg Intravenous Given 08/04/24 1142)  sodium chloride  0.9 % [name] 1,000 mL (0 mLs Intravenous Stopped 08/04/24 1259)  ondansetron  (ZOFRAN ) injection 4 mg (4 mg Intravenous Given 08/04/24 1142)                                                                                                                                     Procedures Procedures  (including critical care time)  Medical Decision Making / ED Course    Medical Decision Making:    KEARSTON PUTMAN is a 73 y.o. female with past medical history as [name], Kendra [name], Kendra [name], Kendra [name], Kendra [name], Kendra [name], Kendra [name]. The complaint involves an extensive differential diagnosis and also carries with it a high risk of complications and morbidity.  Serious etiology was  considered. Ddx includes but is not limited to: Kendra [name], [name], Boerhaave syndrome, esophagitis, etc.  Complete initial physical exam performed, notably the patient was in no acute distress.    Reviewed and confirmed nursing documentation for past medical history, family history, social history.  Vital signs reviewed.    Kendra [name]> - Patient reports unable to swallow since getting turkey stuck in her throat yesterday. - No respiratory complaints, no drooling stridor or trismus.  She is nontoxic, well-appearing - GI to take for endo, dispo per GI  Clinical Course as of 08/04/24 1704  Fri Aug 04, 2024  1222 She follows with [facility]  Patient still unable to tolerate p.o. intake after glucagon , diet soda. Airway patent. Labs are stable.  Will consult GI [SG]  1252 Spoke w/ [facility], will come eval. Keep NPO o/w [SG]    Clinical Course User Index [SG] Elnor Jayson LABOR, DO                    Additional history obtained: -Additional history obtained from family -External records from outside source obtained and reviewed including: Chart review including previous notes, labs, imaging, consultation notes including  Home meds allergies   Lab Tests: -I ordered, reviewed, and interpreted labs.   The pertinent results include:   Labs Reviewed  BASIC METABOLIC PANEL WITH GFR - Abnormal; Notable for the following components:      Result Value   Glucose, Bld 115 (*)    Creatinine, Ser 1.01 (*)    GFR, Estimated 59 (*)    All other components within normal limits  CBC    Notable for labs stable  EKG   EKG Interpretation Date/Time:    Ventricular Rate:    PR Interval:    QRS Duration:  QT Interval:    QTC Calculation:   R Axis:      Text Interpretation:           Imaging Studies ordered: na   Medicines ordered and prescription drug management: Meds ordered this encounter  Medications   glucagon  (human recombinant) (GLUCAGEN ) injection 1 mg   sodium  chloride 0.9 % [name] 1,000 mL   ondansetron  (ZOFRAN ) injection 4 mg   0.9 %  sodium chloride  infusion    -I have reviewed the patients home medicines and have made adjustments as needed   Consultations Obtained: I requested consultation with the GI,  and discussed lab and imaging findings as well as pertinent plan - they recommend: to endo   Cardiac Monitoring: Continuous pulse oximetry interpreted by myself, 99% on RA.    Social Determinants of Health:  Diagnosis or treatment significantly limited by social determinants of health: Kendra [name]   Reevaluation: After the interventions noted above, I reevaluated the patient and found that they have stayed the same  Co morbidities that complicate the patient evaluation  Past Medical History:  Diagnosis Date   Arthritis    Complication of anesthesia    Family history of adverse reaction to anesthesia    Fibromyalgia    [name] (gastroesophageal reflux disease)    Kendra [name]    PONV (postoperative nausea and vomiting)    Sleep apnea    uses mouth guard      Dispostion: Disposition decision including need for hospitalization was considered, and patient admitted to the hospital.    Final Clinical Impression(s) / ED Diagnoses Final diagnoses:  Kendra [name] obstruction of intestine (HCC)        Elnor Jayson LABOR, DO 08/04/24 1704

## 2024-08-04 NOTE — Discharge Instructions (Addendum)
YOU HAD AN ENDOSCOPIC PROCEDURE TODAY: Refer to the procedure report and other information in the discharge instructions given to you for any specific questions about what was found during the examination. If this information does not answer your questions, please call [place] office at [phone] to clarify.   YOU SHOULD EXPECT: Some feelings of bloating in the abdomen. Passage of more gas than usual. Walking can help get rid of the air that was put into your GI tract during the procedure and reduce the bloating. If you had a lower endoscopy (such as a colonoscopy or flexible sigmoidoscopy) you may notice spotting of blood in your stool or on the toilet paper. Some abdominal soreness may be present for a day or two, also.  DIET: Your first meal following the procedure should be a light meal and then it is ok to progress to your normal diet. A half-sandwich or bowl of soup is an example of a good first meal. Heavy or fried foods are harder to digest and may make you feel nauseous or bloated. Drink plenty of fluids but you should avoid alcoholic beverages for 24 hours. If you had a esophageal dilation, please see attached instructions for diet.    ACTIVITY: Your care partner should take you home directly after the procedure. You should plan to take it easy, moving slowly for the rest of the day. You can resume normal activity the day after the procedure however YOU SHOULD NOT DRIVE, use power tools, machinery or perform tasks that involve climbing or major physical exertion for 24 hours (because of the sedation medicines used during the test).   SYMPTOMS TO REPORT IMMEDIATELY: A gastroenterologist can be reached at any hour. Please call [phone]  for any of the following symptoms:  Following lower endoscopy (colonoscopy, flexible sigmoidoscopy) Excessive amounts of blood in the stool  Significant tenderness, worsening of abdominal pains  Swelling of the abdomen that is new, acute  Fever of 100 or  higher  Following upper endoscopy (EGD, EUS, ERCP, esophageal dilation) Vomiting of blood or coffee ground material  New, significant abdominal pain  New, significant chest pain or pain under the shoulder blades  Painful or persistently difficult swallowing  New shortness of breath  Black, tarry-looking or red, bloody stools  FOLLOW UP:  If any biopsies were taken you will be contacted by phone or by letter within the next 1-3 weeks. Call [phone]  if you have not heard about the biopsies in 3 weeks.  Please also call with any specific questions about appointments or follow up tests.YOU HAD AN ENDOSCOPIC PROCEDURE TODAY: Refer to the procedure report and other information in the discharge instructions given to you for any specific questions about what was found during the examination. If this information does not answer your questions, please call [place] office at [phone] to clarify.   YOU SHOULD EXPECT: Some feelings of bloating in the abdomen. Passage of more gas than usual. Walking can help get rid of the air that was put into your GI tract during the procedure and reduce the bloating. If you had a lower endoscopy (such as a colonoscopy or flexible sigmoidoscopy) you may notice spotting of blood in your stool or on the toilet paper. Some abdominal soreness may be present for a day or two, also.  DIET: Your first meal following the procedure should be a light meal and then it is ok to progress to your normal diet. A half-sandwich or bowl of soup is an example of a  good first meal. Heavy or fried foods are harder to digest and may make you feel nauseous or bloated. Drink plenty of fluids but you should avoid alcoholic beverages for 24 hours. If you had a esophageal dilation, please see attached instructions for diet.    ACTIVITY: Your care partner should take you home directly after the procedure. You should plan to take it easy, moving slowly for the rest of the day. You can resume  normal activity the day after the procedure however YOU SHOULD NOT DRIVE, use power tools, machinery or perform tasks that involve climbing or major physical exertion for 24 hours (because of the sedation medicines used during the test).   SYMPTOMS TO REPORT IMMEDIATELY: A gastroenterologist can be reached at any hour. Please call [phone]  for any of the following symptoms:  Following upper endoscopy (EGD, EUS, ERCP, esophageal dilation) Vomiting of blood or coffee ground material  New, significant abdominal pain  New, significant chest pain or pain under the shoulder blades  Painful or persistently difficult swallowing  New shortness of breath  Black, tarry-looking or red, bloody stools  FOLLOW UP:  If any biopsies were taken you will be contacted by phone or by letter within the next 1-3 weeks. Call [phone]  if you have not heard about the biopsies in 3 weeks.  Please also call with any specific questions about appointments or follow up tests.

## 2024-08-04 NOTE — Anesthesia Procedure Notes (Signed)
 Procedure Name: Intubation Date/Time: 08/04/2024 2:26 PM  Performed by: Lockie Flesher, CRNAPre-anesthesia Checklist: Patient identified, Emergency Drugs available, Suction available and Patient being monitored Patient Re-evaluated:Patient Re-evaluated prior to induction Oxygen Delivery Method: Circle System Utilized Preoxygenation: Pre-oxygenation with 100% oxygen Induction Type: IV induction Ventilation: Mask ventilation without difficulty Laryngoscope Size: Mac and 3 Grade View: Grade I Tube type: Oral Tube size: 7.0 mm Number of attempts: 1 Airway Equipment and Method: Stylet and Oral airway Placement Confirmation: ETT inserted through vocal cords under direct vision, positive ETCO2 and breath sounds checked- equal and bilateral Secured at: 22 cm Tube secured with: Tape Dental Injury: Teeth and Oropharynx as per pre-operative assessment

## 2024-08-04 NOTE — Op Note (Signed)
 [facility] Patient Name: Kendra [name] Procedure Date : 08/04/2024 MRN: [id number] Attending MD: Norleen SAILOR. Abran , MD, [id number] Date of Birth: [birth year] CSN: [id number] Age: 73 Admit Type: Inpatient Procedure:                Upper GI endoscopy with removal of esophageal                            foreign body (food impaction) Indications:              Dysphagia, food impaction Providers:                Norleen SAILOR. Abran, MD, Ozell Pouch, Corene Southgate,                            Technician Referring MD:             Jolynn Pack, EDP Medicines:                Monitored Anesthesia Care Complications:            No immediate complications. Estimated Blood Loss:     Estimated blood loss: none. Procedure:                Pre-Anesthesia Assessment:                           - Prior to the procedure, a History and Physical                            was performed, and patient medications and                            allergies were reviewed. The patient's tolerance of                            previous anesthesia was also reviewed. The risks                            and benefits of the procedure and the sedation                            options and risks were discussed with the patient.                            All questions were answered, and informed consent                            was obtained. Prior Anticoagulants: The patient has                            taken Plavix  (clopidogrel ), last dose was 1 day                            prior to procedure. ASA Grade Assessment: III - A  patient with severe systemic disease. After                            reviewing the risks and benefits, the patient was                            deemed in satisfactory condition to undergo the                            procedure.                           After obtaining informed consent, the endoscope was                            passed under direct  vision. Throughout the                            procedure, the patient's blood pressure, pulse, and                            oxygen saturations were monitored continuously. The                            [id number]-[id number] ([id number]) Olympus endoscope was introduced                            through the mouth, and advanced to the second part                            of duodenum. The upper GI endoscopy was                            accomplished without difficulty. The patient                            tolerated the procedure well. Scope In: Scope Out: Findings:      The esophagus revealed an impacted meat bolus in the distalmost portion.       This was advanced into the stomach without incident. There was an       underlying peptic stricture which measured approximately 13 to 14 mm.       The mucosa was slightly macerated. As well, the esophagus was acutely       angulated and the most distal portion.      The stomach revealed a large hiatal hernia. The stomach was otherwise       unremarkable.      The examined duodenum was normal.      The cardia and gastric fundus were normal on retroflexion, save hiatal       hernia. Impression:               1. Esophageal food impaction status post endoscopic                            removal  2. GERD with peptic stricture                           3. Large hiatal hernia                           4. Otherwise unremarkable EGD. Recommendation:           1. Patient has a contact number available for                            emergencies. The signs and symptoms of potential                            delayed complications were discussed with the                            patient. Return to normal activities tomorrow.                            Written discharge instructions were provided to the                            patient.                           2. Modified diet as follows. Clear liquids for 2                             hours followed by full liquids and soft foods for                            48 hours. Chew all food well.                           3. Continue present medications, including                            pantoprazole                            4. Continue Plavix                            5. Return to your primary gastroenterologist to                            schedule elective esophageal dilation within                            clinically appropriate.                           All of the above was discussed with the patient and                            her family. As well,  they were provided a copy of                            this endoscopy report. She is ready for discharge                            home Procedure Code(s):        --- Professional ---                           [phone], Esophagogastroduodenoscopy, flexible,                            transoral; diagnostic, including collection of                            specimen(s) by brushing or washing, when performed                            (separate procedure) Diagnosis Code(s):        --- Professional ---                           R13.10, Dysphagia, unspecified CPT copyright 2022 [employer]. All rights reserved. The codes documented in this report are preliminary and upon coder review may  be revised to meet current compliance requirements. Norleen SAILOR. Abran, MD 08/04/2024 2:57:57 PM This report has been signed electronically. Number of Addenda: 0

## 2024-08-04 NOTE — Transfer of Care (Signed)
 Immediate Anesthesia Transfer of Care Note  Patient: Kendra [name]  Procedure(s) Performed: EGD (ESOPHAGOGASTRODUODENOSCOPY)  Patient Location: PACU  Anesthesia Type:General  Level of Consciousness: awake, alert , and oriented  Airway & Oxygen Therapy: Patient Spontanous Breathing and Patient connected to nasal cannula oxygen  Post-op Assessment: Report given to RN and Post -op Vital signs reviewed and stable  Post vital signs: Reviewed and stable  Last Vitals:  Vitals Value Taken Time  BP 118/56 08/04/24 14:50  Temp 36.2 C 08/04/24 14:41  Pulse 70 08/04/24 14:51  Resp 19 08/04/24 14:51  SpO2 96 % 08/04/24 14:51  Vitals shown include unfiled device data.  Last Pain:  Vitals:   08/04/24 1450  TempSrc:   PainSc: 0-No pain         Complications: No notable events documented.

## 2024-08-04 NOTE — Anesthesia Postprocedure Evaluation (Signed)
 Anesthesia Post Note  Patient: Kendra [name]  Procedure(s) Performed: EGD (ESOPHAGOGASTRODUODENOSCOPY)     Patient location during evaluation: PACU Anesthesia Type: General Level of consciousness: awake and alert Pain management: pain level controlled Vital Signs Assessment: post-procedure vital signs reviewed and stable Respiratory status: spontaneous breathing, nonlabored ventilation, respiratory function stable and patient connected to nasal cannula oxygen Cardiovascular status: blood pressure returned to baseline and stable Postop Assessment: no apparent nausea or vomiting Anesthetic complications: no   No notable events documented.  Last Vitals:  Vitals:   08/04/24 1500 08/04/24 1510  BP: 124/74 127/67  Pulse: 75 73  Resp: 19 (!) 26  Temp:    SpO2: 97% 96%    Last Pain:  Vitals:   08/04/24 1510  TempSrc:   PainSc: 0-No pain                 Epifanio Lamar BRAVO

## 2024-08-04 NOTE — Consult Note (Addendum)
 Referring Provider: Dr. Jayson Pereyra Primary Care Physician:  Gladis Dannielle DEL, PA-C Primary Gastroenterologist:  Dr. Marvis, unassigned   Reason for Consultation: Food bolus impaction  HPI: Kendra [name] is a 73 y.o. female with a past medical history of hypertension, hyperlipidemia, CVA 06/21/2024 on Plavix , IDA, neuropathy, lumbar radiculopathy, obesity, OSA, esophageal dysphagia with past food bolus impaction 08/2020.  She presented to the ED today for suspected food bolus impaction.  She was eating turkey yesterday for Thanksgiving and felt a piece of the turkey got stuck in her throat.  She not been able to swallow food since then.  She has been having difficulty swallowing secretions and if she drinks water  it comes right back up.  She received glucagon  and Coca-Cola 1-1/2 hours ago in the ED without improvement.  No nausea or vomiting.  No abdominal pain.  No bloody or black stools.  No NSAID use.  Patient is on Plavix  secondary to CVA, last dose was taken yesterday 8:30 AM.  No NSAID use.  Labs in the ED showed a WBC count of 9.1.  Hemoglobin 14.  Hematocrit 43.9.  Platelets 269.  BUN 13.  Creatinine 1.01.  Sodium 141.  Potassium 4.0.  A GI consult requested for EGD to remove food bolus impaction.   She was recently admitted to the hospital 10/15 - 06/23/2024 with headaches and numbness to the right hand. She was hypertensive. CT scan of the head did not show any acute findings but showed remote infarction in the right periventricular white matter and left thalamus. MRI of the brain showed scattered infarcts, punctate acute infarcts in the right middle cerebellar peduncle, right cerebellar hemisphere, small acute supratentorial infarcts involve the splenium of the corpus callosum on the left and left parietal and posterior left frontal cortex and subcortical white matter. Patient admitted for the stroke workup. She underwent implantable loop recorder placement.  Discharged home 06/23/2024.   Plavix  was started on 06/24/2024.  She presented to the ED 07/20/2024 with a 5-minute episode of aphasia.  Repeat MRI did not show any acute findings and it was thought she possibly had a TIA.  Patient was stable and was discharged home with plans to follow-up with cardiology as an outpatient regarding loop recorder results.  GI PROCEDURES:  EGD 08/16/2020 by Dr. Aneita due to food bolus impaction: - Food impaction in the distal esophagus. Removal was successful. - Benign-appearing esophageal stenosis. Edematous, friable.  - Medium-sized hiatal hernia.  - Normal duodenal bulb and second portion of the duodenum.  Past Medical History:  Diagnosis Date   Arthritis    Complication of anesthesia    Family history of adverse reaction to anesthesia    Fibromyalgia    GERD (gastroesophageal reflux disease)    Hypertension    PONV (postoperative nausea and vomiting)    Sleep apnea    uses mouth guard    Past Surgical History:  Procedure Laterality Date   ABDOMINAL HYSTERECTOMY  2001   CARTILAGE SURGERY     CESAREAN SECTION  1967   CHOLECYSTECTOMY  2005   ESOPHAGOGASTRODUODENOSCOPY (EGD) WITH PROPOFOL  N/A 08/16/2020   Procedure: ESOPHAGOGASTRODUODENOSCOPY (EGD) WITH PROPOFOL ;  Surgeon: Aneita Gwendlyn DASEN, MD;  Location: [facility] ENDOSCOPY;  Service: Endoscopy;  Laterality: N/A;   FOREIGN BODY REMOVAL N/A 08/16/2020   Procedure: FOREIGN BODY REMOVAL;  Surgeon: Aneita Gwendlyn DASEN, MD;  Location: [facility] ENDOSCOPY;  Service: Endoscopy;  Laterality: N/A;   HIP PINNING,CANNULATED Right 04/14/2020   Procedure: CANNULATED HIP PINNING;  Surgeon:  Jerri Kay HERO, MD;  Location: [facility] OR;  Service: Orthopedics;  Laterality: Right;   LOOP RECORDER INSERTION N/A 06/23/2024   Procedure: LOOP RECORDER INSERTION;  Surgeon: Nancey Eulas BRAVO, MD;  Location: [facility] INVASIVE CV LAB;  Service: Cardiovascular;  Laterality: N/A;   MENISCUS REPAIR     TOTAL KNEE ARTHROPLASTY Right 09/12/2018   Procedure: TOTAL KNEE ARTHROPLASTY;   Surgeon: Rubie Kemps, MD;  Location: [facility] ORS;  Service: Orthopedics;  Laterality: Right;   TUBAL LIGATION  1984   WRIST SURGERY Right    Torn Ligament    Prior to Admission medications   Medication Sig Start Date End Date Taking? Authorizing Provider  acetaminophen  (TYLENOL ) 325 MG tablet Take 500 mg by mouth every 6 (six) hours as needed for mild pain (pain score 1-3).    [provider]  amLODipine  (NORVASC ) 5 MG tablet Take 1 tablet (5 mg total) by mouth at bedtime. 06/23/24   Jillian Buttery, MD  ascorbic acid (VITAMIN C) 500 MG tablet Take 500 mg by mouth in the morning.    [provider]  atorvastatin  (LIPITOR) 40 MG tablet Take 1 tablet (40 mg total) by mouth daily. 06/24/24   Adhikari, Amrit, MD  BACLOFEN PO Take 1 tablet by mouth 2 (two) times daily as needed (for muscle spasms). Patient not taking: Reported on 07/31/2024    [provider]  budesonide-formoterol (SYMBICORT) 160-4.5 MCG/ACT inhaler Inhale 2 puffs into the lungs 2 (two) times daily as needed (for flares).  Patient not taking: Reported on 07/31/2024 11/28/19   [provider]  butalbital -acetaminophen -caffeine  (FIORICET ) 50-325-40 MG tablet Take 1 tablet by mouth every 4 (four) hours as needed for headache or migraine. 06/23/24   Jillian Buttery, MD  Cholecalciferol (VITAMIN D3) 50 MCG (2000 UT) TABS Take 4,000 Units by mouth daily.    [provider]  clopidogrel  (PLAVIX ) 75 MG tablet Take 1 tablet (75 mg total) by mouth daily. 06/24/24   Adhikari, Amrit, MD  Cyanocobalamin (VITAMIN B-12) 500 MCG SUBL Place 500 mcg under the tongue daily.    [provider]  ELDERBERRY PO Take 1 capsule by mouth daily. Patient not taking: Reported on 07/31/2024    [provider]  Ferrous Sulfate  (SLOW FE PO) Take 1 tablet by mouth daily with breakfast.    [provider]  fluticasone (FLONASE) 50 MCG/ACT nasal spray Place 1 spray into both nostrils 2 (two) times  daily as needed for allergies or rhinitis. 02/15/20   [provider]  gabapentin  (NEURONTIN ) 400 MG capsule Take 400 mg by mouth 3 (three) times daily.    [provider]  irbesartan  (AVAPRO ) 75 MG tablet Take 1 tablet (75 mg total) by mouth daily. 06/24/24   Jillian Buttery, MD  Multiple Vitamins-Minerals (ONE-A-DAY WOMENS PO) Take 1 tablet by mouth daily with breakfast.    [provider]  ondansetron  (ZOFRAN ) 4 MG tablet Take 1 tablet (4 mg total) by mouth daily as needed for nausea or vomiting. 06/23/24 06/23/25  Jillian Buttery, MD  OVER THE COUNTER MEDICATION Take 1 tablet by mouth See admin instructions. Calcium /Zinc tablet- Take 1 tablet by mouth once a day    [provider]  pantoprazole  (PROTONIX ) 40 MG tablet Take 40 mg by mouth 2 (two) times daily.    [provider]    No current facility-administered medications for this encounter.   Current Outpatient Medications  Medication Sig Dispense Refill   acetaminophen  (TYLENOL ) 325 MG tablet Take 500 mg by  mouth every 6 (six) hours as needed for mild pain (pain score 1-3).     amLODipine  (NORVASC ) 5 MG tablet Take 1 tablet (5 mg total) by mouth at bedtime. 30 tablet 0   ascorbic acid (VITAMIN C) 500 MG tablet Take 500 mg by mouth in the morning.     atorvastatin  (LIPITOR) 40 MG tablet Take 1 tablet (40 mg total) by mouth daily. 60 tablet 0   BACLOFEN PO Take 1 tablet by mouth 2 (two) times daily as needed (for muscle spasms). (Patient not taking: Reported on 07/31/2024)     budesonide-formoterol (SYMBICORT) 160-4.5 MCG/ACT inhaler Inhale 2 puffs into the lungs 2 (two) times daily as needed (for flares).  (Patient not taking: Reported on 07/31/2024)     butalbital -acetaminophen -caffeine  (FIORICET ) 50-325-40 MG tablet Take 1 tablet by mouth every 4 (four) hours as needed for headache or migraine. 14 tablet 0   Cholecalciferol (VITAMIN D3) 50 MCG (2000 UT) TABS Take 4,000 Units by mouth daily.      clopidogrel  (PLAVIX ) 75 MG tablet Take 1 tablet (75 mg total) by mouth daily. 60 tablet 0   Cyanocobalamin (VITAMIN B-12) 500 MCG SUBL Place 500 mcg under the tongue daily.     ELDERBERRY PO Take 1 capsule by mouth daily. (Patient not taking: Reported on 07/31/2024)     Ferrous Sulfate  (SLOW FE PO) Take 1 tablet by mouth daily with breakfast.     fluticasone (FLONASE) 50 MCG/ACT nasal spray Place 1 spray into both nostrils 2 (two) times daily as needed for allergies or rhinitis.     gabapentin  (NEURONTIN ) 400 MG capsule Take 400 mg by mouth 3 (three) times daily.     irbesartan  (AVAPRO ) 75 MG tablet Take 1 tablet (75 mg total) by mouth daily. 30 tablet 0   Multiple Vitamins-Minerals (ONE-A-DAY WOMENS PO) Take 1 tablet by mouth daily with breakfast.     ondansetron  (ZOFRAN ) 4 MG tablet Take 1 tablet (4 mg total) by mouth daily as needed for nausea or vomiting. 20 tablet 0   OVER THE COUNTER MEDICATION Take 1 tablet by mouth See admin instructions. Calcium /Zinc tablet- Take 1 tablet by mouth once a day     pantoprazole  (PROTONIX ) 40 MG tablet Take 40 mg by mouth 2 (two) times daily.      Allergies as of 08/04/2024 - Review Complete 08/04/2024  Allergen Reaction Noted   Ibuprofen Other (See Comments) 06/08/2016   Nsaids Other (See Comments) 08/24/2018   Other Nausea And Vomiting 04/14/2020   Codeine Nausea And Vomiting 06/27/2015   Hydrocodone-acetaminophen  Nausea And Vomiting 06/08/2016   Hydromorphone  Itching 06/08/2016   Morphine  and codeine Nausea And Vomiting and Other (See Comments) 04/14/2020   Oxycodone  Other (See Comments) 04/14/2020   Propoxyphene Nausea And Vomiting 06/08/2016   Tizanidine Other (See Comments) 04/14/2020   Tramadol hcl Nausea And Vomiting and Other (See Comments) 08/24/2018   Prednisone Rash 06/08/2016    No family history on file.  Social History   Socioeconomic History   Marital status: Widowed    Spouse name: Not on file   Number of children: 2    Years of education: 14   Highest education level: Bachelor's degree (e.g., BA, AB, BS)  Occupational History   Occupation: retired  Tobacco Use   Smoking status: Never   Smokeless tobacco: Never  Vaping Use   Vaping status: Never Used  Substance and Sexual Activity   Alcohol use: Never   Drug use: Never   Sexual activity: Not  Currently  Other Topics Concern   Not on file  Social History Narrative   1 cup of decaf coffee, and 1/2 of a tiny can of soda 4oz weekly    Social Drivers of Corporate Investment Banker Strain: Not on file  Food Insecurity: Low Risk  (07/05/2024)   Received from [facility]   Hunger Vital Sign    Within the past 12 months, you worried that your food would run out before you got money to buy more: Never true    Within the past 12 months, the food you bought just didn't last and you didn't have money to get more. : Never true  Transportation Needs: No Transportation Needs (07/05/2024)   Received from [facility]    In the past 12 months, has lack of reliable transportation kept you from medical appointments, meetings, work or from getting things needed for daily living? : No  Physical Activity: Not on file  Stress: Not on file  Social Connections: Moderately Integrated (06/21/2024)   Social Connection and Isolation Panel    Frequency of Communication with Friends and Family: More than three times a week    Frequency of Social Gatherings with Friends and Family: More than three times a week    Attends Religious Services: More than 4 times per year    Active Member of [employer] or Organizations: Yes    Attends Banker Meetings: More than 4 times per year    Marital Status: Widowed  Intimate Partner Violence: Not At Risk (06/21/2024)   Humiliation, Afraid, Rape, and Kick questionnaire    Fear of Current or Ex-Partner: No    Emotionally Abused: No    Physically Abused: No    Sexually Abused: No    Review of Systems: Gen:  Denies fever, sweats or chills. No weight loss.  CV: Denies chest pain, palpitations or edema. Resp: Denies cough, shortness of breath of hemoptysis.  GI: See  HPI.  GU : Denies urinary burning, blood in urine, increased urinary frequency or incontinence. MS: Denies joint pain, muscles aches or weakness. Derm: Denies rash, itchiness, skin lesions or unhealing ulcers. Psych: Denies depression, anxiety, memory loss or confusion. Heme: Denies easy bruising, bleeding. Neuro:  Denies headaches, dizziness or paresthesias. Endo:  Denies any problems with DM, thyroid or adrenal function.  Physical Exam: Vital signs in last 24 hours: Temp:  [98 F (36.7 C)] 98 F (36.7 C) (11/28 1046) Pulse Rate:  [83] 83 (11/28 1046) Resp:  [18] 18 (11/28 1046) BP: (131-168)/(60-81) 132/75 (11/28 1200) SpO2:  [91 %] 91 % (11/28 1046)   General:  Alert 73 year old female in no acute distress. Head:  Normocephalic and atraumatic. Eyes:  No scleral icterus. Conjunctiva pink. Ears:  Normal auditory acuity. Nose:  No deformity, discharge or lesions. Mouth:  Dentition intact. No ulcers or lesions.  Neck:  Supple. No lymphadenopathy or thyromegaly.  Lungs: Breath sounds clear throughout. No wheezes, rhonchi or crackles.  Heart: Regular rate and rhythm, no murmurs. Abdomen: Abdomen soft, nondistended.  Nontender.  Positive bowel sounds to all 4 quadrants. Rectal: Deferred. Musculoskeletal:  Symmetrical without gross deformities.  Pulses:  Normal pulses noted. Extremities: Trace lower extremity edema. Neurologic:  Alert and  oriented x 4. No focal deficits.  Skin:  Intact without significant lesions or rashes. Psych:  Alert and cooperative. Normal mood and affect.  Intake/Output from previous day: No intake/output data recorded. Intake/Output this shift: No intake/output data recorded.  Lab Results:  Recent Labs    08/04/24 1125  WBC 9.1  HGB 14.0  HCT 43.9  PLT 269   BMET Recent Labs     08/04/24 1125  NA 141  K 4.0  CL 103  CO2 25  GLUCOSE 115*  BUN 13  CREATININE 1.01*  CALCIUM  9.4   LFT No results for input(s): PROT, ALBUMIN, AST, ALT, ALKPHOS, BILITOT, BILIDIR, IBILI in the last 72 hours. PT/INR No results for input(s): LABPROT, INR in the last 72 hours. Hepatitis Panel No results for input(s): HEPBSAG, HCVAB, HEPAIGM, HEPBIGM in the last 72 hours.    Studies/Results: No results found.  IMPRESSION/PLAN:  73 year old female with a history of GERD and prior food bolus impaction in 2021 who presents with a food bolus impaction. Patient ate turkey yesterday around 4 PM which became stuck in her upper esophagus.  Patient unable to keep fluids down.  Unable to swallow secretions.  No improvement after glucagon  administered.  She drank Coca-Cola which came back up.  Last dose of Plavix  was 8:30 AM on 11/27.  Hemodynamically stable. - NPO - EGD with general anesthesia/intubation for food bolus impaction, benefits and risks discussed including risk with sedation, risk of bleeding, perforation and infection  - Further recommendation to be determined after EGD completed - Patient to follow-up with her primary GI Dr. Marvis   CVA. MRI of the brain showed  scattered infarcts : punctate acute infarcts in the right middle cerebellar peduncle , right cerebellar hemisphere, small acute supratentorial infarcts involve the splenium of the corpus callosum on the left and left parietal and posterior left frontal cortex and subcortical white matter. On Plavix , last dose was yesterday am at 8:30 AM.   Kendra [name]  08/04/2024, 1:31 PM  GI ATTENDING  History, laboratories, x-rays, prior endoscopy report reviewed personally.  Patient seen and examined personally.  Agree with comprehensive consultation note as outlined above.  High risk patient who presents with acute food impaction.  Now for urgent endoscopy for relief.The nature of the  procedure, as well as the risks, benefits, and alternatives were carefully and thoroughly reviewed with the patient. Ample time for discussion and questions allowed. The patient understood, was satisfied, and agreed to proceed.  Kendra SAILOR. Abran Raddle., M.D. [facility] Division of Gastroenterology

## 2024-08-04 NOTE — ED Notes (Signed)
 ED Provider at bedside.

## 2024-08-04 NOTE — Anesthesia Preprocedure Evaluation (Addendum)
 Anesthesia Evaluation  Patient identified by MRN, date of birth, ID band Patient awake    Reviewed: Allergy & Precautions, NPO status , Patient's Chart, lab work & pertinent test results  History of Anesthesia Complications (+) PONV and history of anesthetic complications  Airway Mallampati: II  TM Distance: >3 FB Neck ROM: Full    Dental   Pulmonary sleep apnea    breath sounds clear to auscultation       Cardiovascular hypertension, Pt. on medications  Rhythm:Regular Rate:Normal     Neuro/Psych  Neuromuscular disease CVA    GI/Hepatic Neg liver ROS, hiatal hernia,GERD  ,,Food impaction   Endo/Other  negative endocrine ROS    Renal/GU negative Renal ROS     Musculoskeletal  (+) Arthritis ,  Fibromyalgia -  Abdominal   Peds  Hematology negative hematology ROS (+)   Anesthesia Other Findings   Reproductive/Obstetrics                              Anesthesia Physical Anesthesia Plan  ASA: 3  Anesthesia Plan: General   Post-op Pain Management:    Induction: Intravenous and Rapid sequence  PONV Risk Score and Plan: 3 and Ondansetron  and Propofol  infusion  Airway Management Planned: Oral ETT  Additional Equipment:   Intra-op Plan:   Post-operative Plan: Extubation in OR  Informed Consent: I have reviewed the patients History and Physical, chart, labs and discussed the procedure including the risks, benefits and alternatives for the proposed anesthesia with the patient or authorized representative who has indicated his/her understanding and acceptance.     Dental advisory given  Plan Discussed with:   Anesthesia Plan Comments:          Anesthesia Quick Evaluation

## 2024-08-05 ENCOUNTER — Encounter (HOSPITAL_COMMUNITY): Payer: Self-pay | Admitting: Internal Medicine

## 2024-08-07 ENCOUNTER — Ambulatory Visit: Payer: Self-pay | Admitting: Cardiovascular Disease

## 2024-08-09 ENCOUNTER — Telehealth: Payer: Self-pay | Admitting: Internal Medicine

## 2024-08-09 NOTE — Telephone Encounter (Signed)
 Pt needs to schedule appointment. Pt never seen in office.

## 2024-08-09 NOTE — Telephone Encounter (Signed)
*  STAT* If patient is at the pharmacy, call can be transferred to refill team.   1. Which medications need to be refilled? (please list name of each medication and dose if known) clopidogrel  (PLAVIX ) 75 MG tablet    2. Would you like to learn more about the convenience, safety, & potential cost savings by using the [facility]? no   3. Are you open to using the [facility] no   4. Which pharmacy/location (including street and city if local pharmacy) is medication to be sent to?  [id number] - [place], [place] - [address]     5. Do they need a 30 day or 90 day supply? 90  Patient was seen in the hospital by Dr. Santo in October 2025. She was prescribed this medication by the hospitalist and is now needing a refill in which she was told that our office needed to refill it.

## 2024-08-19 ENCOUNTER — Other Ambulatory Visit (HOSPITAL_COMMUNITY): Payer: Self-pay

## 2024-08-19 ENCOUNTER — Other Ambulatory Visit: Payer: Self-pay | Admitting: Neurology

## 2024-08-19 MED ORDER — CLOPIDOGREL BISULFATE 75 MG PO TABS: 75.0000 mg | ORAL_TABLET | Freq: Every day | ORAL | 11 refills | Status: AC

## 2024-08-24 ENCOUNTER — Ambulatory Visit: Attending: Cardiovascular Disease

## 2024-08-24 DIAGNOSIS — R7989 Other specified abnormal findings of blood chemistry: Secondary | ICD-10-CM | POA: Diagnosis not present

## 2024-08-24 LAB — CUP PACEART REMOTE DEVICE CHECK
Date Time Interrogation Session: 20251218135113
Implantable Pulse Generator Implant Date: 20251017

## 2024-08-28 ENCOUNTER — Ambulatory Visit

## 2024-08-28 NOTE — Progress Notes (Signed)
 Remote Loop Recorder Transmission

## 2024-09-06 ENCOUNTER — Ambulatory Visit: Payer: Self-pay | Admitting: Cardiovascular Disease

## 2024-09-18 ENCOUNTER — Ambulatory Visit: Attending: Internal Medicine | Admitting: Internal Medicine

## 2024-09-18 VITALS — BP 123/80 | HR 83 | Ht 65.0 in | Wt 221.0 lb

## 2024-09-18 DIAGNOSIS — Z7902 Long term (current) use of antithrombotics/antiplatelets: Secondary | ICD-10-CM | POA: Diagnosis not present

## 2024-09-18 DIAGNOSIS — Z8673 Personal history of transient ischemic attack (TIA), and cerebral infarction without residual deficits: Secondary | ICD-10-CM | POA: Diagnosis not present

## 2024-09-18 DIAGNOSIS — R7989 Other specified abnormal findings of blood chemistry: Secondary | ICD-10-CM | POA: Diagnosis not present
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
## 2024-09-18 NOTE — Patient Instructions (Signed)
 Medication Instructions: Your physician recommends that you continue on your current medications as directed. Please refer to the Current Medication list given to you today.   *If you need a refill on your cardiac medications before your next appointment, please call your pharmacy*  Lab Work: NONE    Testing/Procedures: NONE   Follow-Up: At [facility], you and your health needs are our priority.  As part of our continuing mission to provide you with exceptional heart care, our providers are all part of one team.  This team includes your primary Cardiologist (physician) and Advanced Practice Providers or APPs (Physician Assistants and Nurse Practitioners) who all work together to provide you with the care you need, when you need it.  Your next appointment:   6 month(s)  Provider:   Stanly DELENA Leavens, MD      Other Instructions

## 2024-09-24 ENCOUNTER — Ambulatory Visit: Attending: Cardiovascular Disease

## 2024-09-24 DIAGNOSIS — I639 Cerebral infarction, unspecified: Secondary | ICD-10-CM | POA: Diagnosis not present

## 2024-09-24 DIAGNOSIS — Z8673 Personal history of transient ischemic attack (TIA), and cerebral infarction without residual deficits: Secondary | ICD-10-CM

## 2024-09-25 LAB — CUP PACEART REMOTE DEVICE CHECK
Date Time Interrogation Session: 20260118135111
Implantable Pulse Generator Implant Date: 20251017

## 2024-09-28 ENCOUNTER — Ambulatory Visit

## 2024-09-29 NOTE — Progress Notes (Signed)
 Remote Loop Recorder Transmission

## 2024-09-30 ENCOUNTER — Ambulatory Visit: Payer: Self-pay | Admitting: Cardiovascular Disease

## 2024-10-25 ENCOUNTER — Ambulatory Visit

## 2024-10-30 ENCOUNTER — Ambulatory Visit

## 2024-11-25 ENCOUNTER — Ambulatory Visit

## 2024-11-30 ENCOUNTER — Ambulatory Visit
